# Patient Record
Sex: Male | Born: 1995 | Race: White | Hispanic: No | Marital: Single | Smoking: Light tobacco smoker
Health system: Southern US, Community
[De-identification: ages and names within clinical notes are randomized; demographics above are authoritative.]

## PROBLEM LIST (undated history)

## (undated) DIAGNOSIS — J45909 Unspecified asthma, uncomplicated: Secondary | ICD-10-CM

## (undated) HISTORY — PX: NO PAST SURGERIES: SHX2092

---
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
## 2014-02-25 ENCOUNTER — Encounter: Payer: Self-pay | Admitting: Family Medicine

## 2014-02-25 ENCOUNTER — Ambulatory Visit (INDEPENDENT_AMBULATORY_CARE_PROVIDER_SITE_OTHER): Payer: Medicaid Other | Admitting: Family Medicine

## 2014-02-25 VITALS — Temp 99.0°F | Ht 69.5 in | Wt 269.0 lb

## 2014-02-25 DIAGNOSIS — B349 Viral infection, unspecified: Secondary | ICD-10-CM

## 2014-02-25 MED ORDER — ONDANSETRON HCL 4 MG PO TABS: 4.0000 mg | ORAL_TABLET | Freq: Three times a day (TID) | ORAL | Status: DC | PRN

## 2014-02-25 NOTE — Progress Notes (Signed)
   Subjective:    Patient ID: Luke Barnes, male    DOB: 1995-07-05, 19 y.o.   MRN: [id number]  HPI Patient arrives with complaint of vomiting and diarrhea since yest. Last time vomiting was yest pm but still has some nausea and diarrhea.  Vomited likely five times  Drinking fluids  Diarrhea today also  Felt nauseated today and sg headache  Energy level dimisihed Review of Systems No headache no cough no sore throat no fever    Objective:   Physical Exam Alert mild malaise HEENT normal lungs clear heart regular in rhythm. Abdomen no discrete tenderness good bowel sounds       Assessment & Plan:  Impression viral gastroenteritis plan symptomatic care discussed. Zofran when necessary for nausea. Diet management discussed. WSL

## 2014-02-26 ENCOUNTER — Encounter: Payer: Self-pay | Admitting: Family Medicine

## 2014-03-13 ENCOUNTER — Encounter: Payer: Self-pay | Admitting: Family Medicine

## 2014-03-13 ENCOUNTER — Ambulatory Visit (INDEPENDENT_AMBULATORY_CARE_PROVIDER_SITE_OTHER): Payer: Medicaid Other | Admitting: Family Medicine

## 2014-03-13 VITALS — BP 116/78 | Temp 99.3°F | Ht 66.0 in | Wt 266.0 lb

## 2014-03-13 DIAGNOSIS — J1189 Influenza due to unidentified influenza virus with other manifestations: Secondary | ICD-10-CM | POA: Diagnosis not present

## 2014-03-13 DIAGNOSIS — J111 Influenza due to unidentified influenza virus with other respiratory manifestations: Secondary | ICD-10-CM

## 2014-03-13 MED ORDER — OSELTAMIVIR PHOSPHATE 75 MG PO CAPS: 75.0000 mg | ORAL_CAPSULE | Freq: Two times a day (BID) | ORAL | Status: DC

## 2014-03-13 NOTE — Progress Notes (Signed)
   Subjective:    Patient ID: Luke Barnes, male    DOB: Jan 12, 1995, 19 y.o.   MRN: [id number]  Cough This is a new problem. The current episode started today. Associated symptoms include a fever, headaches, nasal congestion and a sore throat. He has tried nothing for the symptoms.   hfrontal head  A lot of cough  Some aching in the k ees  And elbows  Appetite  tod  Energy level  Low  Wheezing some    Review of Systems  Constitutional: Positive for fever.  HENT: Positive for sore throat.   Respiratory: Positive for cough.   Neurological: Positive for headaches.       Objective:   Physical Exam  Alert moderate malaise. Vitals stable. Low-grade fever. H&[employer] news congestion pharynx normal lungs bronchial cough heart regular rate and rhythm      Assessment & Plan:  Impression influenza discussed plan Tamiflu twice a day 5 days. Symptom care discussed. Warning signs discussed. WSlfL.Influenza-the patient was diagnosed with influenza. Patient/family educated about the flu and warning signs to watch for. If difficulty breathing, severe neck pain and stiffness, cyanosis, disorientation, or progressive worsening then immediately get rechecked at that ER. If progressive symptoms be certain to be rechecked. Supportive measures such as Tylenol/ibuprofen was discussed. No aspirin use in children. And infl

## 2014-03-28 ENCOUNTER — Emergency Department (HOSPITAL_COMMUNITY): Payer: Medicaid Other

## 2014-03-28 ENCOUNTER — Encounter (HOSPITAL_COMMUNITY): Payer: Self-pay | Admitting: Emergency Medicine

## 2014-03-28 ENCOUNTER — Emergency Department (HOSPITAL_COMMUNITY)
Admission: EM | Admit: 2014-03-28 | Discharge: 2014-03-28 | Disposition: A | Discharge status: Home / Self Care | Payer: Medicaid Other | Attending: Emergency Medicine | Admitting: Emergency Medicine

## 2014-03-28 DIAGNOSIS — J45909 Unspecified asthma, uncomplicated: Secondary | ICD-10-CM | POA: Diagnosis present

## 2014-03-28 DIAGNOSIS — Z87891 Personal history of nicotine dependence: Secondary | ICD-10-CM | POA: Diagnosis not present

## 2014-03-28 DIAGNOSIS — J45901 Unspecified asthma with (acute) exacerbation: Secondary | ICD-10-CM | POA: Insufficient documentation

## 2014-03-28 DIAGNOSIS — Z7952 Long term (current) use of systemic steroids: Secondary | ICD-10-CM | POA: Diagnosis not present

## 2014-03-28 DIAGNOSIS — Z79899 Other long term (current) drug therapy: Secondary | ICD-10-CM | POA: Diagnosis not present

## 2014-03-28 HISTORY — DX: Unspecified asthma, uncomplicated: J45.909

## 2014-03-28 LAB — COMPREHENSIVE METABOLIC PANEL
ALT: 66 U/L — AB (ref 0–53)
AST: 41 U/L — ABNORMAL HIGH (ref 0–37)
Albumin: 4.4 g/dL (ref 3.5–5.2)
Alkaline Phosphatase: 117 U/L (ref 39–117)
Anion gap: 10 (ref 5–15)
BUN: 10 mg/dL (ref 6–23)
CALCIUM: 9.2 mg/dL (ref 8.4–10.5)
CO2: 24 mmol/L (ref 19–32)
CREATININE: 0.91 mg/dL (ref 0.50–1.35)
Chloride: 103 mmol/L (ref 96–112)
GFR calc Af Amer: >90 mL/min (ref >90)
GLUCOSE: 107 mg/dL — AB (ref 70–99)
Potassium: 4 mmol/L (ref 3.5–5.1)
SODIUM: 137 mmol/L (ref 135–145)
Total Bilirubin: 0.9 mg/dL (ref 0.3–1.2)
Total Protein: 8.1 g/dL (ref 6.0–8.3)

## 2014-03-28 LAB — CBC WITH DIFFERENTIAL/PLATELET
Basophils Absolute: 0 K/uL (ref 0.0–0.1)
Basophils Relative: 0 % (ref 0–1)
Eosinophils Absolute: 0.2 K/uL (ref 0.0–0.7)
Eosinophils Relative: 1 % (ref 0–5)
HCT: 44.5 % (ref 39.0–52.0)
Hemoglobin: 15.5 g/dL (ref 13.0–17.0)
Lymphocytes Relative: 22 % (ref 12–46)
Lymphs Abs: 3.3 K/uL (ref 0.7–4.0)
MCH: 30 pg (ref 26.0–34.0)
MCHC: 34.8 g/dL (ref 30.0–36.0)
MCV: 86.1 fL (ref 78.0–100.0)
Monocytes Absolute: 0.8 K/uL (ref 0.1–1.0)
Monocytes Relative: 6 % (ref 3–12)
Neutro Abs: 10.6 K/uL — ABNORMAL HIGH (ref 1.7–7.7)
Neutrophils Relative %: 71 % (ref 43–77)
Platelets: 324 K/uL (ref 150–400)
RBC: 5.17 MIL/uL (ref 4.22–5.81)
RDW: 12.9 % (ref 11.5–15.5)
WBC: 14.9 K/uL — ABNORMAL HIGH (ref 4.0–10.5)

## 2014-03-28 MED ORDER — ALBUTEROL SULFATE HFA 108 (90 BASE) MCG/ACT IN AERS: 2.0000 | INHALATION_SPRAY | Freq: Four times a day (QID) | RESPIRATORY_TRACT | Status: DC | PRN

## 2014-03-28 MED ORDER — ALBUTEROL (5 MG/ML) CONTINUOUS INHALATION SOLN
15.0000 mg | INHALATION_SOLUTION | Freq: Once | RESPIRATORY_TRACT | Status: DC
Start: 2014-03-28 — End: 2014-03-28

## 2014-03-28 MED ORDER — IPRATROPIUM-ALBUTEROL 0.5-2.5 (3) MG/3ML IN SOLN: 3.0000 mL | Freq: Once | RESPIRATORY_TRACT | Status: DC

## 2014-03-28 MED ORDER — PREDNISONE 20 MG PO TABS: 20.0000 mg | ORAL_TABLET | Freq: Two times a day (BID) | ORAL | Status: DC

## 2014-03-28 MED ORDER — METHYLPREDNISOLONE SODIUM SUCC 125 MG IJ SOLR
125.0000 mg | Freq: Once | INTRAMUSCULAR | Status: AC
  Administered 2014-03-28: 125 mg via INTRAVENOUS
  Filled 2014-03-28: qty 2

## 2014-03-28 MED ORDER — ALBUTEROL SULFATE (2.5 MG/3ML) 0.083% IN NEBU: 2.5000 mg | INHALATION_SOLUTION | Freq: Once | RESPIRATORY_TRACT | Status: DC

## 2014-03-28 MED ORDER — ALBUTEROL (5 MG/ML) CONTINUOUS INHALATION SOLN
15.0000 mg/h | INHALATION_SOLUTION | Freq: Once | RESPIRATORY_TRACT | Status: AC
  Administered 2014-03-28: 15 mg/h via RESPIRATORY_TRACT
  Filled 2014-03-28: qty 20

## 2014-03-28 MED ORDER — METHYLPREDNISOLONE SODIUM SUCC 125 MG IJ SOLR: 125.0000 mg | Freq: Once | INTRAMUSCULAR | Status: DC

## 2014-03-28 NOTE — ED Notes (Signed)
Pt ambulated in the hall on RA.  Spo2 93-94%.  Pt did not appear in any distress but when asked did report some sob.

## 2014-03-28 NOTE — ED Notes (Signed)
Patient c/o wheezing and shortness of breath that started yesterday afternoon. Patient has hx of asthma and uses inhaler and nebs treatments. Per patient used Nebulizer 2 days ago and has been out of inhaler x3 days. Per patient had flu like syptoms last week but but denies any this week. Denies any fevers.

## 2014-03-28 NOTE — Discharge Instructions (Signed)

## 2014-03-28 NOTE — ED Notes (Signed)
Respiratory paged

## 2014-03-28 NOTE — ED Provider Notes (Signed)
CSN: [id number]     Arrival date & time 03/28/14  0818 History  This chart was scribed for Mancel BaleElliott Luke Azbill, MD by Ronney LionSuzanne Le, ED Scribe. This patient was seen in room APA09/APA09 and the patient's care was started at 8:26 AM.    Chief Complaint  Patient presents with  . Asthma   The history is provided by the patient. No language interpreter was used.    HPI Comments: Luke Barnes is a 19 y.o. male who presents to the Emergency Department complaining of SOB and wheezing that began yesterday afternoon. He endorses associated coughing productive with yellow sputum and feeling "hot and sweaty." Patient states he ran out of his inhaler, which he last used 3 days ago. He still has a nebulizer at home, which he last used 2 days ago, but has been staying at his father's house and hasn't been able to use it. Patient reports dyspnea with walking, as he states that standing up to walk exacerbates his symptoms. Lying supine doesn't affect it. He denies taking any allergy medication. He denies any other medical problems. Patient last had an asthma attack a few years ago. Patient is a former smoker; he quit 3 years ago. He denies fever.   Past Medical History  Diagnosis Date  . Asthma    History reviewed. No pertinent past surgical history. Family History  Problem Relation Age of Onset  . Asthma Father    History  Substance Use Topics  . Smoking status: Former Smoker -- 0.01 packs/day for 1 years    Types: Cigarettes    Quit date: 01/04/2011  . Smokeless tobacco: Never Used  . Alcohol Use: No    Review of Systems  Constitutional: Positive for diaphoresis. Negative for fever.  Respiratory: Positive for shortness of breath and wheezing.   All other systems reviewed and are negative.     Allergies  Review of patient's allergies indicates no known allergies.  Home Medications   Prior to Admission medications   Medication Sig Start Date End Date Taking? Authorizing Provider   beclomethasone (QVAR) 80 MCG/ACT inhaler Inhale 2 puffs into the lungs 2 (two) times daily. 11/08/13  Yes Campbell Richesarolyn C Hoskins, NP  albuterol (PROVENTIL HFA;VENTOLIN HFA) 108 (90 BASE) MCG/ACT inhaler Inhale 2 puffs into the lungs every 6 (six) hours as needed for wheezing or shortness of breath. 03/28/14   Mancel BaleElliott Baylen Dea, MD  predniSONE (DELTASONE) 20 MG tablet Take 1 tablet (20 mg total) by mouth 2 (two) times daily. 03/28/14   Mancel BaleElliott Deamber Buckhalter, MD   BP 107/76 mmHg  Pulse 110  Temp(Src) 98.3 F (36.8 C) (Oral)  Resp 22  Ht 5\' 6"  (1.676 m)  Wt 265 lb (120.203 kg)  BMI 42.79 kg/m2  SpO2 93% Physical Exam  Constitutional: He is oriented to person, place, and time. He appears well-developed and well-nourished.  HENT:  Head: Normocephalic and atraumatic.  Right Ear: External ear normal.  Left Ear: External ear normal.  Eyes: Conjunctivae and EOM are normal. Pupils are equal, round, and reactive to light.  Neck: Normal range of motion and phonation normal. Neck supple.  Cardiovascular: Normal rate, regular rhythm and normal heart sounds.   Pulmonary/Chest: Effort normal. No respiratory distress (No increased work of breathing.). He has wheezes. He exhibits no bony tenderness.  Diffuse expiratory wheezing. No decreased air movement.   Abdominal: Soft. There is no tenderness.  Musculoskeletal: Normal range of motion.  Neurological: He is alert and oriented to person, place, and time. No cranial  nerve deficit or sensory deficit. He exhibits normal muscle tone. Coordination normal.  Skin: Skin is warm, dry and intact.  Psychiatric: He has a normal mood and affect. His behavior is normal. Judgment and thought content normal.  Nursing note and vitals reviewed.   ED Course  Procedures (including critical care time)  Medications  albuterol (PROVENTIL,VENTOLIN) solution continuous neb (15 mg/hr Nebulization Given 03/28/14 0838)  methylPREDNISolone sodium succinate (SOLU-MEDROL) 125 mg/2 mL injection  125 mg (125 mg Intravenous Given 03/28/14 0842)     Patient Vitals for the past 24 hrs:  BP Temp Temp src Pulse Resp SpO2 Height Weight  03/28/14 1224 107/76 mmHg 98.3 F (36.8 C) Oral 110 22 93 % - -  03/28/14 1222 - - - - - 94 % - -  03/28/14 1115 95/60 mmHg 98.1 F (36.7 C) Oral 115 24 95 % - -  03/28/14 1100 95/60 mmHg - - 115 17 92 % - -  03/28/14 1046 101/77 mmHg - - 118 24 92 % - -  03/28/14 1030 - - - (!) 127 23 91 % - -  03/28/14 1000 - - - (!) 122 12 95 % - -  03/28/14 0945 - - - (!) 131 18 97 % - -  03/28/14 0930 113/68 mmHg - - (!) 124 20 96 % - -  03/28/14 0915 - - - (!) 121 16 95 % - -  03/28/14 0900 105/67 mmHg - - 120 15 95 % - -  03/28/14 0845 - - - 119 15 98 % - -  03/28/14 0838 - - - - - 95 % - -  03/28/14 0830 - - - - - 91 % - -  03/28/14 0830 109/60 mmHg - - (!) 121 16 94 % - -  03/28/14 0823 125/89 mmHg 98.2 F (36.8 C) Oral (!) 125 26 91 %  (1.676 m) 265 lb (120.203 kg)    DIAGNOSTIC STUDIES: Oxygen Saturation is 89% on room air, low by my interpretation.    COORDINATION OF CARE: 8:31 AM - Discussed treatment plan with pt at bedside which includes medications and breathing treatment, and pt agreed to plan.   12:01 PM Reevaluation with update and discussion. After initial assessment and treatment, an updated evaluation reveals patient feels better. Oxygen saturation normal on room air. Blood pressure is soft. Lungs have improved air movement, without audible wheezes, Rales or rhonchi. We'll do ambulation trial with oxygen monitoring. [name]   At discharge- Reevaluation with update and discussion. After initial assessment and treatment, an updated evaluation reveals he feels better and is able to ambulate without oxygen desaturation or significant shortness of breath.[name]   Labs Review Labs Reviewed  CBC WITH DIFFERENTIAL/PLATELET - Abnormal; Notable for the following:    WBC 14.9 (*)    Neutro Abs 10.6 (*)    All other components  within normal limits  COMPREHENSIVE METABOLIC PANEL - Abnormal; Notable for the following:    Glucose, Bld 107 (*)    AST 41 (*)    ALT 66 (*)    All other components within normal limits    Imaging Review Dg Chest 2 View  03/28/2014   CLINICAL DATA:  Shortness of breath.  Wheezing.  EXAM: CHEST  2 VIEW  COMPARISON:  None.  FINDINGS: Heart size and vascularity are normal. No infiltrates or effusions. Slight peribronchial thickening. No osseous abnormality.  IMPRESSION: Slight peribronchial thickening consistent with bronchitis.   Electronically Signed   By:  Francene Boyers M.D.   On: 03/28/2014 10:27     EKG Interpretation   Date/Time:  Friday March 28 2014 08:30:06 EDT Ventricular Rate:  120 PR Interval:  134 QRS Duration: 96 QT Interval:  312 QTC Calculation: 441 R Axis:   79 Text Interpretation:  Sinus tachycardia Baseline wander in lead(s) II III  aVR aVL aVF V1 V2 V3 V4 No old tracing to compare Confirmed by [facility]  MD,  Addylynn Balin [phone]) on 03/28/2014 8:37:27 AM      MDM   Final diagnoses:  Asthma exacerbation   Evaluation is consistent with exacerbation of asthma. Doubt ACS, PE or pneumonia.   Nursing Notes Reviewed/ Care Coordinated Applicable Imaging Reviewed Interpretation of Laboratory Data incorporated into ED treatment  The patient appears reasonably screened and/or stabilized for discharge and I doubt any other medical condition or other [facility] requiring further screening, evaluation, or treatment in the ED at this time prior to discharge.  Plan: Home Medications- Albuterol, Prednisone; Home Treatments- rest; return here if the recommended treatment, does not improve the symptoms; Recommended follow up- PCP 1 week   I personally performed the services described in this documentation, which was scribed in my presence. The recorded information has been reviewed and is accurate.       Mancel Bale, MD 03/28/14 [phone]

## 2014-06-29 ENCOUNTER — Emergency Department (HOSPITAL_COMMUNITY): Payer: Medicaid Other

## 2014-06-29 ENCOUNTER — Encounter (HOSPITAL_COMMUNITY): Payer: Self-pay | Admitting: Emergency Medicine

## 2014-06-29 ENCOUNTER — Emergency Department (HOSPITAL_COMMUNITY)
Admission: EM | Admit: 2014-06-29 | Discharge: 2014-06-29 | Disposition: A | Discharge status: Home / Self Care | Payer: Medicaid Other | Attending: Emergency Medicine | Admitting: Emergency Medicine

## 2014-06-29 DIAGNOSIS — Z79899 Other long term (current) drug therapy: Secondary | ICD-10-CM | POA: Diagnosis not present

## 2014-06-29 DIAGNOSIS — Z87891 Personal history of nicotine dependence: Secondary | ICD-10-CM | POA: Diagnosis not present

## 2014-06-29 DIAGNOSIS — Z7951 Long term (current) use of inhaled steroids: Secondary | ICD-10-CM | POA: Insufficient documentation

## 2014-06-29 DIAGNOSIS — W450XXA Nail entering through skin, initial encounter: Secondary | ICD-10-CM | POA: Diagnosis not present

## 2014-06-29 DIAGNOSIS — Y9289 Other specified places as the place of occurrence of the external cause: Secondary | ICD-10-CM | POA: Diagnosis not present

## 2014-06-29 DIAGNOSIS — J45909 Unspecified asthma, uncomplicated: Secondary | ICD-10-CM | POA: Insufficient documentation

## 2014-06-29 DIAGNOSIS — S91332A Puncture wound without foreign body, left foot, initial encounter: Secondary | ICD-10-CM | POA: Diagnosis present

## 2014-06-29 DIAGNOSIS — Z23 Encounter for immunization: Secondary | ICD-10-CM | POA: Diagnosis not present

## 2014-06-29 DIAGNOSIS — Z7952 Long term (current) use of systemic steroids: Secondary | ICD-10-CM | POA: Diagnosis not present

## 2014-06-29 DIAGNOSIS — Y998 Other external cause status: Secondary | ICD-10-CM | POA: Diagnosis not present

## 2014-06-29 DIAGNOSIS — Y9389 Activity, other specified: Secondary | ICD-10-CM | POA: Insufficient documentation

## 2014-06-29 MED ORDER — AMOXICILLIN-POT CLAVULANATE 875-125 MG PO TABS
1.0000 | ORAL_TABLET | Freq: Once | ORAL | Status: AC
  Administered 2014-06-29: 1 via ORAL
  Filled 2014-06-29: qty 1

## 2014-06-29 MED ORDER — NAPROXEN 500 MG PO TABS: 500.0000 mg | ORAL_TABLET | Freq: Two times a day (BID) | ORAL | Status: DC

## 2014-06-29 MED ORDER — AMOXICILLIN-POT CLAVULANATE 875-125 MG PO TABS: 1.0000 | ORAL_TABLET | Freq: Two times a day (BID) | ORAL | Status: DC

## 2014-06-29 MED ORDER — TETANUS-DIPHTH-ACELL PERTUSSIS 5-2.5-18.5 LF-MCG/0.5 IM SUSP
0.5000 mL | Freq: Once | INTRAMUSCULAR | Status: AC
  Administered 2014-06-29: 0.5 mL via INTRAMUSCULAR
  Filled 2014-06-29: qty 0.5

## 2014-06-29 NOTE — ED Provider Notes (Signed)
CSN: [id number]     Arrival date & time 06/29/14  1717 History  This chart was scribed for non-physician practitioner, Pauline Aus, PA-C, working with Rolland Porter, MD, by Roxy Cedar ED Scribe. This patient was seen in room APFT21/APFT21 and the patient's care was started at 5:32 PM    Chief Complaint  Patient presents with  . Puncture Wound   Patient is a 19 y.o. male presenting with foot injury. The history is provided by the patient. No language interpreter was used.  Foot Injury Location:  Foot Foot location:  L foot and sole of L foot Pain details:    Radiates to:  Does not radiate   Severity:  Moderate   Onset quality:  Sudden   Duration:  2 hours   Timing:  Constant   Progression:  Unchanged Chronicity:  New Associated symptoms: no back pain and no fever     HPI Comments: Luke Barnes is a 19 y.o. male who presents to the Emergency Department complaining of moderate pain to left foot due to puncture wound that occurred 1.5 hours ago. Patient states he was cleaning out an old trailer and throwing trash away when he accidentally stepped on an old, rusted nail. He states that nail went through his shoe and into his foot. Pt denies excessive bleeding, swelling, numbness or any other associated injury. He states he pull the nail back out with pliers and applied neosporin and a bandaid to wound. He states that the wound initially bled but is currently controlled. He reports associated pain with movement of toes. He denies pain to dorsal aspect of foot. His last tetanus shot was given 7 years ago. NKDA.  Past Medical History  Diagnosis Date  . Asthma    History reviewed. No pertinent past surgical history. Family History  Problem Relation Age of Onset  . Asthma Father    History  Substance Use Topics  . Smoking status: Former Smoker -- 0.01 packs/day for 1 years    Types: Cigarettes    Quit date: 01/04/2011  . Smokeless tobacco: Never Used  . Alcohol Use: No   Review  of Systems  Constitutional: Negative for fever and chills.  Musculoskeletal: Negative for back pain, joint swelling and arthralgias.  Skin: Positive for wound (puncture wound to left foot).       Laceration   Neurological: Negative for dizziness, weakness and numbness.  Hematological: Does not bruise/bleed easily.  All other systems reviewed and are negative.  Allergies  Review of patient's allergies indicates no known allergies.  Home Medications   Prior to Admission medications   Medication Sig Start Date End Date Taking? Authorizing Provider  albuterol (PROVENTIL HFA;VENTOLIN HFA) 108 (90 BASE) MCG/ACT inhaler Inhale 2 puffs into the lungs every 6 (six) hours as needed for wheezing or shortness of breath. 03/28/14   Mancel Bale, MD  beclomethasone (QVAR) 80 MCG/ACT inhaler Inhale 2 puffs into the lungs 2 (two) times daily. 11/08/13   Campbell Riches, NP  predniSONE (DELTASONE) 20 MG tablet Take 1 tablet (20 mg total) by mouth 2 (two) times daily. 03/28/14   Mancel Bale, MD   Triage Vitals: BP 131/67 mmHg  Pulse 100  Temp(Src) 99.4 F (37.4 C) (Oral)  Resp 18  Ht 5\' 9"  (1.753 m)  Wt 245 lb (111.131 kg)  BMI 36.16 kg/m2  SpO2 99%  Physical Exam  Constitutional: He appears well-developed and well-nourished.  HENT:  Head: Normocephalic and atraumatic.  Cardiovascular: Normal rate and normal  heart sounds.   Pulmonary/Chest: Effort normal. No respiratory distress.  Musculoskeletal: Normal range of motion. He exhibits tenderness. He exhibits no edema.  Neurological: He is alert. Coordination normal.  Distal sensation in tact.   Skin: Skin is warm and dry. No rash noted. He is not diaphoretic. No erythema.  Puncture wound to the plantar surface of the left foot between the first and second toes. Bleeding controlled.   Psychiatric: He has a normal mood and affect.  Nursing note and vitals reviewed.  ED Course  Procedures (including critical care time)  DIAGNOSTIC  STUDIES: Oxygen Saturation is 99% on RA, normal by my interpretation.    COORDINATION OF CARE: 5:39 PM- Discussed plans to order diagnostic imaging of left foot. Will give pt Tdap booster shot. Advised patient to cleanse foot, apply band aid and observe for sings of infection. Advised patient to return if he has onset of increased pain, redness or streaking.  Pt advised of plan for treatment and pt agrees.  Labs Review Labs Reviewed - No data to display  Imaging Review Dg Foot Complete Left  06/29/2014   CLINICAL DATA:  Stepped on a rusty nail today. In true wound between the left first and second toes.  EXAM: LEFT FOOT - COMPLETE 3+ VIEW  COMPARISON:  None.  FINDINGS: No fracture, foreign body, malalignment, abnormal gas in soft tissues, or other significant abnormality.  IMPRESSION: Negative.   Electronically Signed   By: Gaylyn Rong M.D.   On: 06/29/2014 18:01     EKG Interpretation None     MDM   Final diagnoses:  Puncture wound of foot, left, initial encounter   NV intact. XR neg for FB.  Pt advised of risk of infection and agrees to close f/u and to return here for worsening sx's   I personally performed the services described in this documentation, which was scribed in my presence. The recorded information has been reviewed and is accurate.    Pauline Aus, PA-C 07/01/14 1841  Rolland Porter, MD 07/07/14 2037

## 2014-06-29 NOTE — ED Notes (Signed)
Per patient stepped on dirty nail, which punctured left foot. Patient reports nail penetrating foot approx 1.5 inches in which he removed. Denies any bleeding at this time. Per patient last tdap at 19 years old (7 years ago).

## 2014-06-29 NOTE — Discharge Instructions (Signed)
Puncture Wound °A puncture wound happens when a sharp object pokes a hole in the skin. A puncture wound can cause an infection because germs can go under the skin during the injury. °HOME CARE  °· Change your bandage (dressing) once a day, or as told by your doctor. If the bandage sticks, soak it in water. °· Keep all doctor visits as told. °· Only take medicine as told by your doctor. °· Take your medicine (antibiotics) as told. Finish them even if you start to feel better. °You may need a tetanus shot if: °· You cannot remember when you had your last tetanus shot. °· You have never had a tetanus shot. °If you need a tetanus shot and you choose not to have one, you may get tetanus. Sickness from tetanus can be serious. °You may need a rabies shot if an animal bite caused your wound. °GET HELP RIGHT AWAY IF:  °· Your wound is red, puffy (swollen), or painful. °· You see red lines on the skin near the wound. °· You have a bad smell coming from the wound or bandage. °· You have yellowish-white fluid (pus) coming from the wound. °· Your medicine is not working. °· You notice an object in the wound. °· You have a fever. °· You have severe pain. °· You have trouble breathing. °· You feel dizzy or pass out (faint). °· You keep throwing up (vomiting). °· You lose feeling (numbness) in your arm or leg, or you cannot move your arm or leg. °· Your problems get worse. °MAKE SURE YOU:  °· Understand these instructions. °· Will watch your condition. °· Will get help right away if you are not doing well or get worse. °Document Released: 09/29/2007 Document Revised: 03/14/2011 Document Reviewed: 06/08/2010 °[employer]® Patient Information ©2015 [employer]. This information is not intended to replace advice given to you by your health care provider. Make sure you discuss any questions you have with your health care provider. ° °

## 2014-08-25 ENCOUNTER — Emergency Department (HOSPITAL_COMMUNITY): Payer: Medicaid Other

## 2014-08-25 ENCOUNTER — Emergency Department (HOSPITAL_COMMUNITY)
Admission: EM | Admit: 2014-08-25 | Discharge: 2014-08-25 | Disposition: A | Discharge status: Home / Self Care | Payer: Medicaid Other | Attending: Emergency Medicine | Admitting: Emergency Medicine

## 2014-08-25 ENCOUNTER — Encounter (HOSPITAL_COMMUNITY): Payer: Self-pay | Admitting: *Deleted

## 2014-08-25 DIAGNOSIS — J45909 Unspecified asthma, uncomplicated: Secondary | ICD-10-CM | POA: Diagnosis not present

## 2014-08-25 DIAGNOSIS — S4991XA Unspecified injury of right shoulder and upper arm, initial encounter: Secondary | ICD-10-CM | POA: Diagnosis not present

## 2014-08-25 DIAGNOSIS — Z87891 Personal history of nicotine dependence: Secondary | ICD-10-CM | POA: Insufficient documentation

## 2014-08-25 DIAGNOSIS — Y9389 Activity, other specified: Secondary | ICD-10-CM | POA: Diagnosis not present

## 2014-08-25 DIAGNOSIS — Y92410 Unspecified street and highway as the place of occurrence of the external cause: Secondary | ICD-10-CM | POA: Diagnosis not present

## 2014-08-25 DIAGNOSIS — M545 Low back pain, unspecified: Secondary | ICD-10-CM

## 2014-08-25 DIAGNOSIS — Z79899 Other long term (current) drug therapy: Secondary | ICD-10-CM | POA: Diagnosis not present

## 2014-08-25 DIAGNOSIS — Y998 Other external cause status: Secondary | ICD-10-CM | POA: Insufficient documentation

## 2014-08-25 DIAGNOSIS — S3992XA Unspecified injury of lower back, initial encounter: Secondary | ICD-10-CM | POA: Diagnosis not present

## 2014-08-25 DIAGNOSIS — S199XXA Unspecified injury of neck, initial encounter: Secondary | ICD-10-CM | POA: Diagnosis present

## 2014-08-25 DIAGNOSIS — S161XXA Strain of muscle, fascia and tendon at neck level, initial encounter: Secondary | ICD-10-CM | POA: Diagnosis not present

## 2014-08-25 DIAGNOSIS — M25511 Pain in right shoulder: Secondary | ICD-10-CM

## 2014-08-25 MED ORDER — IBUPROFEN 600 MG PO TABS: 600.0000 mg | ORAL_TABLET | Freq: Four times a day (QID) | ORAL | Status: DC | PRN

## 2014-08-25 MED ORDER — CYCLOBENZAPRINE HCL 5 MG PO TABS: 5.0000 mg | ORAL_TABLET | Freq: Three times a day (TID) | ORAL | Status: DC | PRN
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
## 2014-08-27 ENCOUNTER — Ambulatory Visit (INDEPENDENT_AMBULATORY_CARE_PROVIDER_SITE_OTHER): Payer: Medicaid Other | Admitting: Family Medicine

## 2014-08-27 ENCOUNTER — Encounter: Payer: Self-pay | Admitting: Family Medicine

## 2014-08-27 VITALS — Ht 66.0 in | Wt 256.2 lb

## 2014-08-27 DIAGNOSIS — M25511 Pain in right shoulder: Secondary | ICD-10-CM
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
## 2014-12-09 ENCOUNTER — Ambulatory Visit: Payer: Self-pay | Admitting: Nurse Practitioner

## 2014-12-10 ENCOUNTER — Other Ambulatory Visit: Payer: Self-pay | Admitting: Family Medicine

## 2015-01-11 ENCOUNTER — Emergency Department
Admission: EM | Admit: 2015-01-11 | Discharge: 2015-01-11 | Disposition: A | Discharge status: Home / Self Care | Payer: Medicaid Other | Attending: Emergency Medicine | Admitting: Emergency Medicine

## 2015-01-11 ENCOUNTER — Emergency Department: Payer: Medicaid Other

## 2015-01-11 ENCOUNTER — Encounter: Payer: Self-pay | Admitting: Emergency Medicine

## 2015-01-11 DIAGNOSIS — J45901 Unspecified asthma with (acute) exacerbation: Secondary | ICD-10-CM | POA: Insufficient documentation

## 2015-01-11 DIAGNOSIS — Z87891 Personal history of nicotine dependence: Secondary | ICD-10-CM | POA: Insufficient documentation

## 2015-01-11 DIAGNOSIS — R000 Tachycardia, unspecified: Secondary | ICD-10-CM | POA: Insufficient documentation

## 2015-01-11 DIAGNOSIS — R0602 Shortness of breath: Secondary | ICD-10-CM | POA: Diagnosis present

## 2015-01-11 MED ORDER — ALBUTEROL SULFATE (2.5 MG/3ML) 0.083% IN NEBU
2.5000 mg | INHALATION_SOLUTION | Freq: Once | RESPIRATORY_TRACT | Status: DC
Start: 2015-01-11 — End: 2015-01-11

## 2015-01-11 MED ORDER — PREDNISONE 50 MG PO TABS: 50.0000 mg | ORAL_TABLET | Freq: Every day | ORAL | Status: DC

## 2015-01-11 MED ORDER — IPRATROPIUM-ALBUTEROL 0.5-2.5 (3) MG/3ML IN SOLN
6.0000 mL | Freq: Once | RESPIRATORY_TRACT | Status: AC
  Administered 2015-01-11: 6 mL via RESPIRATORY_TRACT

## 2015-01-11 MED ORDER — ALBUTEROL SULFATE (2.5 MG/3ML) 0.083% IN NEBU
2.5000 mg | INHALATION_SOLUTION | Freq: Once | RESPIRATORY_TRACT | Status: AC
  Administered 2015-01-11: 2.5 mg via RESPIRATORY_TRACT

## 2015-01-11 MED ORDER — PREDNISONE 20 MG PO TABS
100.0000 mg | ORAL_TABLET | Freq: Once | ORAL | Status: AC
  Administered 2015-01-11: 100 mg via ORAL
  Filled 2015-01-11: qty 5

## 2015-01-11 MED ORDER — METHYLPREDNISOLONE SODIUM SUCC 125 MG IJ SOLR: 125.0000 mg | Freq: Once | INTRAMUSCULAR | Status: DC

## 2015-01-11 MED ORDER — ALBUTEROL SULFATE (2.5 MG/3ML) 0.083% IN NEBU
INHALATION_SOLUTION | RESPIRATORY_TRACT | Status: AC
  Administered 2015-01-11: 2.5 mg via RESPIRATORY_TRACT
  Filled 2015-01-11: qty 3

## 2015-01-11 MED ORDER — IPRATROPIUM-ALBUTEROL 0.5-2.5 (3) MG/3ML IN SOLN
RESPIRATORY_TRACT | Status: AC
  Administered 2015-01-11: 6 mL via RESPIRATORY_TRACT
  Filled 2015-01-11: qty 9

## 2015-01-11 MED ORDER — ALBUTEROL SULFATE HFA 108 (90 BASE) MCG/ACT IN AERS
2.0000 | INHALATION_SPRAY | Freq: Once | RESPIRATORY_TRACT | Status: AC
  Administered 2015-01-11: 2 via RESPIRATORY_TRACT
  Filled 2015-01-11: qty 6.7

## 2015-01-11 MED ORDER — ALBUTEROL SULFATE HFA 108 (90 BASE) MCG/ACT IN AERS: 2.0000 | INHALATION_SPRAY | Freq: Once | RESPIRATORY_TRACT | Status: DC

## 2015-01-11 NOTE — ED Notes (Signed)
Pt state he has been coughing this morning and is unable to obtain a refill of his inhaler until next month.

## 2015-01-11 NOTE — ED Notes (Signed)
C/o sob.  Inspiratory wheezing noted at triage.  Skin w/d.  Pt reports sold sx and hxof asthma, out of inhalers.

## 2015-01-11 NOTE — ED Provider Notes (Signed)
[facility] Emergency Department Provider Note  ____________________________________________  Time seen: On arrival  I have reviewed the triage vital signs and the nursing notes.   HISTORY  Chief Complaint Respiratory Distress    HPI Luke Barnes is a 20 y.o. male who presents with shortness of breath. He reports his asthma has been acting up over the past couple days but became much worse this morning. He is out of his rescue inhaler. He does not smoke but his mother does. No fevers or chills. Nonproductive cough. No recent travel, leg pain or swelling.     Past Medical History  Diagnosis Date  . Asthma     Patient Active Problem List   Diagnosis Date Noted  . Asthma, chronic 04/01/2012  . ADHD (attention deficit hyperactivity disorder) 04/01/2012    History reviewed. No pertinent past surgical history.  Current Outpatient Rx  Name  Route  Sig  Dispense  Refill  . cyclobenzaprine (FLEXERIL) 5 MG tablet   Oral   Take 1 tablet (5 mg total) by mouth 3 (three) times daily as needed for muscle spasms.   15 tablet   0   . ibuprofen (ADVIL,MOTRIN) 600 MG tablet   Oral   Take 1 tablet (600 mg total) by mouth every 6 (six) hours as needed.   15 tablet   0   . PROVENTIL HFA 108 (90 BASE) MCG/ACT inhaler      INHALE 2 PUFFS EVERY 4 HOURS AS NEEDED FOR WHEEZING OR SHORTNESS OF BREATH.   6.7 g   0     Allergies Review of patient's allergies indicates no known allergies.  Family History  Problem Relation Age of Onset  . Asthma Father     Social History Social History  Substance Use Topics  . Smoking status: Former Smoker -- 0.01 packs/day for 1 years    Types: Cigarettes    Quit date: 01/04/2011  . Smokeless tobacco: Never Used  . Alcohol Use: No    Review of Systems  Constitutional: Negative for fever or chills Eyes: Negative for discharge. ENT: Negative for sore throat Cardiovascular: Negative for chest pain. Respiratory:  As above Gastrointestinal: Negative for abdominal pain  Musculoskeletal: Negative for back pain. Skin: Negative for rash. Neurological: Negative for headaches Psychiatric: No anxiety or depression    ____________________________________________   PHYSICAL EXAM:  VITAL SIGNS: ED Triage Vitals  Enc Vitals Group     BP 01/11/15 1024 135/79 mmHg     Pulse Rate 01/11/15 1024 101     Resp 01/11/15 1024 20     Temp --      Temp src --      SpO2 01/11/15 1024 93 %     Weight 01/11/15 1024 250 lb (113.399 kg)     Height 01/11/15 1024 6\' 2"  (1.88 m)     Head Cir --      Peak Flow --      Pain Score 01/11/15 1025 0     Pain Loc --      Pain Edu? --      Excl. in GC? --      Constitutional: Alert and oriented. Well appearing and in no distress. Eyes: Conjunctivae are normal.  ENT   Head: Normocephalic and atraumatic.   Mouth/Throat: Mucous membranes are moist. Cardiovascular: Tachycardic,. normal and symmetric distal pulses are present in all extremities. No murmurs, rubs, or gallops. Respiratory: Mild tachypnea, wheezing bilateral Gastrointestinal: Soft and non-tender in all quadrants. No distention.  There is no CVA tenderness. Genitourinary: deferred Musculoskeletal: Nontender with normal range of motion in all extremities. No lower extremity tenderness nor edema. Neurologic:  Normal speech and language. No gross focal neurologic deficits are appreciated. Skin:  Skin is warm, dry and intact. No rash noted. Psychiatric: Mood and affect are normal. Patient exhibits appropriate insight and judgment.  ____________________________________________    LABS (pertinent positives/negatives)  Labs Reviewed - No data to display  ____________________________________________   EKG  None  ____________________________________________    RADIOLOGY I have personally reviewed any xrays that were ordered on this patient: Chest x-ray  unremarkable  ____________________________________________   PROCEDURES  Procedure(s) performed: none  Critical Care performed: none  ____________________________________________   INITIAL IMPRESSION / ASSESSMENT AND PLAN / ED COURSE  Pertinent labs & imaging results that were available during my care of the patient were reviewed by me and considered in my medical decision making (see chart for details).  Patient presents with asthma exacerbation with moderate intensity. Prednisone 100 mg by mouth ordered as well as DuoNeb x3. We will reassess after treatment  Patient with significant improvement after treatment. No further wheezing on exam. Heart rate has normalized and he feels significant better. Offered admission but patient would like to go home. We will provide him with an inhaler. Return precautions discussed at length. ____________________________________________   FINAL CLINICAL IMPRESSION(S) / ED DIAGNOSES  Final diagnoses:  Asthma exacerbation     Jene Every, MD 01/11/15 [phone]

## 2015-01-11 NOTE — Discharge Instructions (Signed)
Asthma Attack Prevention While you may not be able to control the fact that you have asthma, you can take actions to prevent asthma attacks. The best way to prevent asthma attacks is to maintain good control of your asthma. You can achieve this by:  Taking your medicines as directed.  Avoiding things that can irritate your airways or make your asthma symptoms worse (asthma triggers).  Keeping track of how well your asthma is controlled and of any changes in your symptoms.  Responding quickly to worsening asthma symptoms (asthma attack).  Seeking emergency care when it is needed. WHAT ARE SOME WAYS TO PREVENT AN ASTHMA ATTACK? Have a Plan Work with your health care provider to create a written plan for managing and treating your asthma attacks (asthma action plan). This plan includes:  A list of your asthma triggers and how you can avoid them.  Information on when medicines should be taken and when their dosages should be changed.  The use of a device that measures how well your lungs are working (peak flow meter). Monitor Your Asthma Use your peak flow meter and record your results in a journal every day. A drop in your peak flow numbers on one or more days may indicate the start of an asthma attack. This can happen even before you start to feel symptoms. You can prevent an asthma attack from getting worse by following the steps in your asthma action plan. Avoid Asthma Triggers Work with your asthma health care provider to find out what your asthma triggers are. This can be done by:  Allergy testing.  Keeping a journal that notes when asthma attacks occur and the factors that may have contributed to them.  Determining if there are other medical conditions that are making your asthma worse. Once you have determined your asthma triggers, take steps to avoid them. This may include avoiding excessive or prolonged exposure to:  Dust. Have someone dust and vacuum your home for you once or  twice a week. Using a high-efficiency particulate arrestance (HEPA) vacuum is best.  Smoke. This includes campfire smoke, forest fire smoke, and secondhand smoke from tobacco products.  Pet dander. Avoid contact with animals that you know you are allergic to.  Allergens from trees, grasses or pollens. Avoid spending a lot of time outdoors when pollen counts are high, and on very windy days.  Very cold, dry, or humid air.  Mold.  Foods that contain high amounts of sulfites.  Strong odors.  Outdoor air pollutants, such as engine exhaust.  Indoor air pollutants, such as aerosol sprays and fumes from household cleaners.  Household pests, including dust mites and cockroaches, and pest droppings.  Certain medicines, including NSAIDs. Always talk to your health care provider before stopping or starting any new medicines. Medicines Take over-the-counter and prescription medicines only as told by your health care provider. Many asthma attacks can be prevented by carefully following your medicine schedule. Taking your medicines correctly is especially important when you cannot avoid certain asthma triggers. Act Quickly If an asthma attack does happen, acting quickly can decrease how severe it is and how long it lasts. Take these steps:   Pay attention to your symptoms. If you are coughing, wheezing, or having difficulty breathing, do not wait to see if your symptoms go away on their own. Follow your asthma action plan.  If you have followed your asthma action plan and your symptoms are not improving, call your health care provider or seek immediate medical care   at the nearest hospital. It is important to note how often you need to use your fast-acting rescue inhaler. If you are using your rescue inhaler more often, it may mean that your asthma is not under control. Adjusting your asthma treatment plan may help you to prevent future asthma attacks and help you to gain better control of your  condition. HOW CAN I PREVENT AN ASTHMA ATTACK WHEN I EXERCISE? Follow advice from your health care provider about whether you should use your fast-acting inhaler before exercising. Many people with asthma experience exercise-induced bronchoconstriction (EIB). This condition often worsens during vigorous exercise in cold, humid, or dry environments. Usually, people with EIB can stay very active by pre-treating with a fast-acting inhaler before exercising.   This information is not intended to replace advice given to you by your health care provider. Make sure you discuss any questions you have with your health care provider.   Document Released: 12/08/2008 Document Revised: 09/10/2014 Document Reviewed: 05/22/2014 Elsevier Interactive Patient Education 2016 [employer]  

## 2015-01-19 ENCOUNTER — Encounter: Payer: Self-pay | Admitting: Family Medicine

## 2015-01-19 ENCOUNTER — Ambulatory Visit (INDEPENDENT_AMBULATORY_CARE_PROVIDER_SITE_OTHER): Payer: Medicaid Other | Admitting: Family Medicine

## 2015-01-19 VITALS — BP 118/70 | Temp 99.2°F | Ht 72.0 in | Wt 257.0 lb

## 2015-01-19 DIAGNOSIS — J4520 Mild intermittent asthma, uncomplicated: Secondary | ICD-10-CM

## 2015-01-19 MED ORDER — PREDNISONE 20 MG PO TABS: ORAL_TABLET | ORAL | Status: DC

## 2015-01-19 MED ORDER — ALBUTEROL SULFATE HFA 108 (90 BASE) MCG/ACT IN AERS: INHALATION_SPRAY | RESPIRATORY_TRACT | Status: DC

## 2015-01-19 MED ORDER — BECLOMETHASONE DIPROPIONATE 40 MCG/ACT IN AERS: 2.0000 | INHALATION_SPRAY | Freq: Two times a day (BID) | RESPIRATORY_TRACT | Status: DC

## 2015-01-19 NOTE — Progress Notes (Signed)
   Subjective:    Patient ID: Luke Barnes, male    DOB: 1995-09-29, 20 y.o.   MRN: [id number]  HPIFollow up on asthma. Pt states he is having to use his inhaler 5 -6 6 times a day. Normally he uses twice daily.      No longer smoking next  Is been using inhaler fairly frequently in recent weeks and excessively for months.   Review of Systems  no headache no chest pain and back pain no fever nonproductive cough    Objective:   Physical Exam    alert vital stable HEENT normal lungs faint wheeze no tachypnea heart rare rhythm     Assessment & Plan:   asthma suboptimum control plan add Qvar 2 sprays twice a day prednisone taper symptom care discussed WSL

## 2015-11-17 ENCOUNTER — Telehealth: Payer: Self-pay | Admitting: *Deleted

## 2015-11-17 ENCOUNTER — Other Ambulatory Visit: Payer: Self-pay | Admitting: *Deleted

## 2015-11-17 MED ORDER — FLUTICASONE-SALMETEROL 100-50 MCG/DOSE IN AEPB: 1.0000 | INHALATION_SPRAY | Freq: Two times a day (BID) | RESPIRATORY_TRACT | 2 refills | Status: DC

## 2015-11-17 NOTE — Telephone Encounter (Signed)
Refills sent to pharm

## 2015-11-17 NOTE — Telephone Encounter (Signed)
Ok thre e mo worth 

## 2015-11-17 NOTE — Telephone Encounter (Signed)
Refill request for advair 100/5 #60 inhale one dose twice daily. Originally prescribed by [facility] not on med list. Pt last [facility] 01/19/15 for asthma[place] [facility]  [place]

## 2015-12-06 ENCOUNTER — Other Ambulatory Visit: Payer: Self-pay | Admitting: Family Medicine

## 2016-04-15 ENCOUNTER — Other Ambulatory Visit: Payer: Self-pay | Admitting: Family Medicine

## 2016-05-09 ENCOUNTER — Ambulatory Visit (INDEPENDENT_AMBULATORY_CARE_PROVIDER_SITE_OTHER): Payer: 59 | Admitting: Family Medicine

## 2016-05-09 ENCOUNTER — Encounter: Payer: Self-pay | Admitting: Family Medicine

## 2016-05-09 VITALS — BP 118/84 | Temp 98.4°F | Ht 71.0 in | Wt 216.0 lb

## 2016-05-09 DIAGNOSIS — J4520 Mild intermittent asthma, uncomplicated: Secondary | ICD-10-CM

## 2016-05-09 MED ORDER — ALBUTEROL SULFATE HFA 108 (90 BASE) MCG/ACT IN AERS: 2.0000 | INHALATION_SPRAY | Freq: Four times a day (QID) | RESPIRATORY_TRACT | 5 refills | Status: DC | PRN

## 2016-05-09 MED ORDER — FLUTICASONE-SALMETEROL 100-50 MCG/DOSE IN AEPB: 1.0000 | INHALATION_SPRAY | Freq: Two times a day (BID) | RESPIRATORY_TRACT | 11 refills | Status: DC

## 2016-05-09 NOTE — Progress Notes (Signed)
   Subjective:    Patient ID: Luke Barnes, male    DOB: 1995/05/18, 21 y.o.   MRN: [id number]  Asthma  This is a chronic problem. His past medical history is significant for asthma.  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 Blood pressure good on repeat. HEENT normal. Lungs clear. Heart regular rate and rhythm.      Assessment & Plan:  Impression chronic persistent asthma stable when on Advair not stable and off discussed importance of compliance discussed medications refilled

## 2016-05-17 ENCOUNTER — Telehealth: Payer: Self-pay | Admitting: Family Medicine

## 2016-05-17 MED ORDER — ALBUTEROL SULFATE HFA 108 (90 BASE) MCG/ACT IN AERS: 2.0000 | INHALATION_SPRAY | Freq: Four times a day (QID) | RESPIRATORY_TRACT | 5 refills | Status: DC | PRN

## 2016-05-17 NOTE — Telephone Encounter (Signed)
Patient was seen 5/7 and prescribe ventolin inhaler and his insurance wouldn't cover it,he states cant afford it can you change it to a different inhaler. [facility]

## 2016-05-17 NOTE — Telephone Encounter (Signed)
Albuterol generic two sprays qid prn

## 2016-05-17 NOTE — Telephone Encounter (Signed)
Spoke with patient and informed him per Dr.Steve Luking-we are sending in generic albuterol inhaler. Patient verbalized understanding.

## 2016-07-26 ENCOUNTER — Ambulatory Visit (INDEPENDENT_AMBULATORY_CARE_PROVIDER_SITE_OTHER): Payer: 59 | Admitting: Family Medicine

## 2016-07-26 ENCOUNTER — Encounter: Payer: Self-pay | Admitting: Family Medicine

## 2016-07-26 VITALS — BP 122/76 | Ht 71.0 in | Wt 217.8 lb

## 2016-07-26 DIAGNOSIS — J4520 Mild intermittent asthma, uncomplicated: Secondary | ICD-10-CM

## 2016-07-26 NOTE — Progress Notes (Signed)
   Subjective:    Patient ID: Luke Barnes, male    DOB: [birth year], 21 y.o.   MRN: [id number]  HPI Patient arrives and states his insurance will no longer pay for Proventil and he needs his inhaler changed to ventolin.   Compliant with Advair. States it is definitely helps him. Having difficulty getting albuterol covered by his insurance.  No chest pain no headache  Fortunately continues to exercise  Using advair bid  Ran out of albuterol, using aunt's albuterol   Review of Systems No headache, no major weight loss or weight gain, no chest pain no back pain abdominal pain no change in bowel habits complete ROS otherwise negative     Objective:   Physical Exam Alert and oriented, vitals reviewed and stable, NAD ENT-TM's and ext canals WNL bilat via otoscopic exam Soft palate, tonsils and post pharynx WNL via oropharyngeal exam Neck-symmetric, no masses; thyroid nonpalpable and nontender Pulmonary-no tachypnea or accessory muscle use; Clear without wheezes via auscultation Card--no abnrml murmurs, rhythm reg and rate WNL Carotid pulses symmetric, without bruits Of this on repeat.       Assessment & Plan:  Impression asthma clinically stable. With need for rescue inhaler. We'll work with the pharmacy to try to obtain

## 2016-08-31 ENCOUNTER — Encounter: Payer: Self-pay | Admitting: Emergency Medicine

## 2016-08-31 ENCOUNTER — Ambulatory Visit: Payer: Self-pay

## 2016-08-31 ENCOUNTER — Ambulatory Visit
Admission: EM | Admit: 2016-08-31 | Discharge: 2016-08-31 | Disposition: A | Discharge status: Home / Self Care | Payer: Medicaid Other | Attending: Family Medicine | Admitting: Family Medicine

## 2016-08-31 DIAGNOSIS — R748 Abnormal levels of other serum enzymes: Secondary | ICD-10-CM | POA: Diagnosis not present

## 2016-08-31 DIAGNOSIS — F17210 Nicotine dependence, cigarettes, uncomplicated: Secondary | ICD-10-CM | POA: Insufficient documentation

## 2016-08-31 DIAGNOSIS — R0602 Shortness of breath: Secondary | ICD-10-CM | POA: Insufficient documentation

## 2016-08-31 DIAGNOSIS — E860 Dehydration: Secondary | ICD-10-CM | POA: Diagnosis not present

## 2016-08-31 DIAGNOSIS — R0600 Dyspnea, unspecified: Secondary | ICD-10-CM

## 2016-08-31 DIAGNOSIS — J45901 Unspecified asthma with (acute) exacerbation: Secondary | ICD-10-CM | POA: Insufficient documentation

## 2016-08-31 DIAGNOSIS — R0789 Other chest pain: Secondary | ICD-10-CM | POA: Diagnosis not present

## 2016-08-31 LAB — COMPREHENSIVE METABOLIC PANEL
ALK PHOS: 101 U/L (ref 38–126)
ALT: 39 U/L (ref 17–63)
ANION GAP: 15 (ref 5–15)
AST: 44 U/L — ABNORMAL HIGH (ref 15–41)
Albumin: 5.1 g/dL — ABNORMAL HIGH (ref 3.5–5.0)
BILIRUBIN TOTAL: 0.9 mg/dL (ref 0.3–1.2)
BUN: 24 mg/dL — ABNORMAL HIGH (ref 6–20)
CALCIUM: 9.9 mg/dL (ref 8.9–10.3)
CO2: 20 mmol/L — ABNORMAL LOW (ref 22–32)
Chloride: 100 mmol/L — ABNORMAL LOW (ref 101–111)
Creatinine, Ser: 1.25 mg/dL — ABNORMAL HIGH (ref 0.61–1.24)
GFR calc non Af Amer: >60 mL/min (ref >60)
Glucose, Bld: 111 mg/dL — ABNORMAL HIGH (ref 65–99)
Potassium: 3.9 mmol/L (ref 3.5–5.1)
Sodium: 135 mmol/L (ref 135–145)
Total Protein: 8.6 g/dL — ABNORMAL HIGH (ref 6.5–8.1)

## 2016-08-31 LAB — URINALYSIS, COMPLETE (UACMP) WITH MICROSCOPIC
GLUCOSE, UA: NEGATIVE mg/dL
Hgb urine dipstick: NEGATIVE
LEUKOCYTES UA: NEGATIVE
NITRITE: NEGATIVE
PH: 5.5 (ref 5.0–8.0)
Protein, ur: 100 mg/dL — AB
Specific Gravity, Urine: >1.03 — ABNORMAL HIGH (ref 1.005–1.030)
Squamous Epithelial / LPF: NONE SEEN

## 2016-08-31 LAB — CK: Total CK: 790 U/L — ABNORMAL HIGH (ref 49–397)

## 2016-08-31 LAB — FIBRIN DERIVATIVES D-DIMER (ARMC ONLY): Fibrin derivatives D-dimer (ARMC): 146.58 (ref 0.00–499.00)

## 2016-08-31 LAB — CBC WITH DIFFERENTIAL/PLATELET
BASOS PCT: 1 %
Basophils Absolute: 0.1 10*3/uL (ref 0–0.1)
EOS ABS: 0.2 10*3/uL (ref 0–0.7)
Eosinophils Relative: 1 %
HEMATOCRIT: 51.8 % (ref 40.0–52.0)
HEMOGLOBIN: 17.9 g/dL (ref 13.0–18.0)
LYMPHS ABS: 3.1 10*3/uL (ref 1.0–3.6)
Lymphocytes Relative: 22 %
MCH: 30.9 pg (ref 26.0–34.0)
MCHC: 34.5 g/dL (ref 32.0–36.0)
MCV: 89.6 fL (ref 80.0–100.0)
MONO ABS: 1 10*3/uL (ref 0.2–1.0)
MONOS PCT: 7 %
NEUTROS ABS: 9.7 10*3/uL — AB (ref 1.4–6.5)
NEUTROS PCT: 69 %
Platelets: 276 10*3/uL (ref 150–440)
RBC: 5.79 MIL/uL (ref 4.40–5.90)
RDW: 12.8 % (ref 11.5–14.5)
WBC: 14.1 10*3/uL — ABNORMAL HIGH (ref 3.8–10.6)

## 2016-08-31 LAB — CKMB (ARMC ONLY): CK, MB: 10.5 ng/mL — ABNORMAL HIGH (ref 0.5–5.0)

## 2016-08-31 LAB — TROPONIN I: Troponin I: <0.03 ng/mL (ref <0.03)

## 2016-08-31 MED ORDER — IPRATROPIUM-ALBUTEROL 0.5-2.5 (3) MG/3ML IN SOLN
3.0000 mL | Freq: Once | RESPIRATORY_TRACT | Status: AC
  Administered 2016-08-31: 3 mL via RESPIRATORY_TRACT

## 2016-08-31 MED ORDER — PREDNISONE 10 MG (21) PO TBPK: ORAL_TABLET | ORAL | 0 refills | Status: DC

## 2016-08-31 NOTE — ED Triage Notes (Signed)
Patient reports SOB and right arm cramps that started today while he was at work.  Patient reports his symptoms have improved.

## 2016-08-31 NOTE — ED Provider Notes (Signed)
[facility]    CSN: [id number] Arrival date & time: 08/31/16  1340     History   Chief Complaint Chief Complaint  Patient presents with  . Shortness of Breath    HPI Luke Barnes is a 21 y.o. male.   Patient is here because of shortness of breath. The shortness of breath started yesterday. He states he was at work lifting heavy sheet box yesterday when he started getting short of breath. He states the shortness of breath recurred again today. This time he did not have chest pain like he did yesterday state he drove here at our way to be seen and evaluated. He states this is not like his asthma but while he was here he started wheezing. Reports just feeling bad aching all over and not feeling well. States he never had anything like this before. He does not smoke but there are several people who smoke around him. He does have a history of asthma and uses Advair twice a day and albuterol inhaler as well. 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 No previous surgeries or operations. The chest pain only occurred yesterday. No pertinent family medical history relevant for today's visit other than his father having asthma as well no previous surgeries or operations no known drug allergies.                The history is provided by the patient. No language interpreter was used.  Shortness of Breath  Severity:  Severe Onset quality:  Sudden Duration:  2 days Timing:  Intermittent Progression:  Worsening Chronicity:  New Context: activity and strong odors   Relieved by:  Inhaler Worsened by:  Deep breathing, exertion, movement and coughing Associated symptoms: chest pain and wheezing   Associated symptoms: no cough   Associated symptoms comment:  Myalgia   Past Medical History:  Diagnosis Date  . Asthma     Patient Active Problem List   Diagnosis Date Noted  . Asthma, chronic 04/01/2012  . ADHD (attention deficit hyperactivity disorder) 04/01/2012     History reviewed. No pertinent surgical history.     Home Medications    Prior to Admission medications   Medication Sig Start Date End Date Taking? Authorizing Provider  albuterol (PROVENTIL HFA;VENTOLIN HFA) 108 (90 Base) MCG/ACT inhaler Inhale 2 puffs into the lungs every 6 (six) hours as needed for wheezing or shortness of breath. 05/17/16   Merlyn Albert, MD  Fluticasone-Salmeterol (ADVAIR DISKUS) 100-50 MCG/DOSE AEPB Inhale 1 puff into the lungs 2 (two) times daily. 05/09/16   Merlyn Albert, MD  predniSONE (STERAPRED UNI-PAK 21 TAB) 10 MG (21) TBPK tablet Sig 6 tablet day 1, 5 tablets day 2, 4 tablets day 3,,3tablets day 4, 2 tablets day 5, 1 tablet day 6 take all tablets orally 08/31/16   Hassan Rowan, MD    Family History Family History  Problem Relation Age of Onset  . Asthma Father     Social History Social History  Substance Use Topics  . Smoking status: Former Smoker    Packs/day: 0.01    Years: 1.00    Types: Cigarettes    Quit date: 01/04/2011  . Smokeless tobacco: Never Used  . Alcohol use No     Allergies   Patient has no known allergies.   Review of Systems Review of Systems  Respiratory: Positive for shortness of breath and wheezing. Negative for cough.   Cardiovascular: Positive for chest pain.  All other  systems reviewed and are negative.    Physical Exam Triage Vital Signs ED Triage Vitals  Enc Vitals Group     BP 08/31/16 1352 134/90     Pulse Rate 08/31/16 1352 (!) 101     Resp 08/31/16 1352 16     Temp 08/31/16 1352 98.6 F (37 C)     Temp Source 08/31/16 1352 Oral     SpO2 08/31/16 1352 98 %     Weight 08/31/16 1351 215 lb (97.5 kg)     Height 08/31/16 1351 5\' 4"  (1.626 m)     Head Circumference --      Peak Flow --      Pain Score 08/31/16 1351 0     Pain Loc --      Pain Edu? --      Excl. in GC? --    No data found.   Updated Vital Signs BP 134/90 (BP Location: Left Arm)   Pulse (!) 101   Temp 98.6 F (37 C)  (Oral)   Resp 16   Ht 5\' 4"  (1.626 m)   Wt 215 lb (97.5 kg)   SpO2 99%   BMI 36.90 kg/m   Visual Acuity Right Eye Distance:   Left Eye Distance:   Bilateral Distance:    Right Eye Near:   Left Eye Near:    Bilateral Near:     Physical Exam  Constitutional: He is oriented to person, place, and time. He appears well-developed and well-nourished. He appears ill.  Markedly sweaty  HENT:  Head: Normocephalic and atraumatic.  Right Ear: External ear normal.  Left Ear: External ear normal.  Nose: Nose normal.  Mouth/Throat: Oropharynx is clear and moist.  Eyes: Pupils are equal, round, and reactive to light. EOM are normal.  Neck: Normal range of motion. Neck supple.  Cardiovascular: Regular rhythm, S1 normal, S2 normal and normal heart sounds.  Tachycardia present.   Pulmonary/Chest: No respiratory distress. He has wheezes.  Abdominal: Soft.  Musculoskeletal: Normal range of motion.  Lymphadenopathy:    He has no cervical adenopathy.  Neurological: He is alert and oriented to person, place, and time.  Skin: Skin is warm.  Psychiatric: He has a normal mood and affect.  Vitals reviewed.    UC Treatments / Results  Labs (all labs ordered are listed, but only abnormal results are displayed) Labs Reviewed  CBC WITH DIFFERENTIAL/PLATELET - Abnormal; Notable for the following:       Result Value   WBC 14.1 (*)    Neutro Abs 9.7 (*)    All other components within normal limits  COMPREHENSIVE METABOLIC PANEL - Abnormal; Notable for the following:    Chloride 100 (*)    CO2 20 (*)    Glucose, Bld 111 (*)    BUN 24 (*)    Creatinine, Ser 1.25 (*)    Total Protein 8.6 (*)    Albumin 5.1 (*)    AST 44 (*)    All other components within normal limits  CKMB([facility] ONLY) - Abnormal; Notable for the following:    CK, MB 10.5 (*)    All other components within normal limits  CK - Abnormal; Notable for the following:    Total CK 790 (*)    All other components within normal  limits  URINALYSIS, COMPLETE (UACMP) WITH MICROSCOPIC - Abnormal; Notable for the following:    APPearance HAZY (*)    Specific Gravity, Urine >1.030 (*)    Bilirubin Urine SMALL (*)  Ketones, ur TRACE (*)    Protein, ur 100 (*)    Bacteria, UA FEW (*)    All other components within normal limits  FIBRIN DERIVATIVES D-DIMER ([facility] ONLY)  TROPONIN I    EKG  EKG Interpretation None      ED ECG REPORT I, Bernadetta Roell H, the attending physician, personally viewed and interpreted this ECG.   Date: 08/31/2016  EKG Time:15:09:55:  sinus tachycardia, normal EKG, normal sinus rh with marked sinus arrhythmiaus tracings  Axis: 48  Intervals:none  ST&T Change: none Radiology Dg Chest 2 View  Result Date: 08/31/2016 CLINICAL DATA:  Onset of shortness of breath and right arm cramping while at work today. History of asthma. Former smoker. EXAM: CHEST  2 VIEW COMPARISON:  Chest x-ray of January 11, 2015 FINDINGS: The lungs are mildly hyperinflated and clear. There is no pneumothorax or pleural effusion. The heart and pulmonary vascularity are normal. The mediastinum is normal in width. The bony thorax exhibits no acute abnormality. IMPRESSION: There is no active cardiopulmonary disease. Mild hyperinflation consistent with known reactive airway disease. Electronically Signed   By: David  [place] M.D.   On: 08/31/2016 15:29    Procedures Procedures (including critical care time)  Medications Ordered in UC Medications  ipratropium-albuterol (DUONEB) 0.5-2.5 (3) MG/3ML nebulizer solution 3 mL (3 mLs Nebulization Given 08/31/16 1457)    Results for orders placed or performed during the hospital encounter of 08/31/16  CBC with Differential  Result Value Ref Range   WBC 14.1 (H) 3.8 - 10.6 K/uL   RBC 5.79 4.40 - 5.90 MIL/uL   Hemoglobin 17.9 13.0 - 18.0 g/dL   HCT 16.1 09.6 - 04.5 %   MCV 89.6 80.0 - 100.0 fL   MCH 30.9 26.0 - 34.0 pg   MCHC 34.5 32.0 - 36.0 g/dL   RDW 40.9 81.1 - 91.4 %     Platelets 276 150 - 440 K/uL   Neutrophils Relative % 69 %   Neutro Abs 9.7 (H) 1.4 - 6.5 K/uL   Lymphocytes Relative 22 %   Lymphs Abs 3.1 1.0 - 3.6 K/uL   Monocytes Relative 7 %   Monocytes Absolute 1.0 0.2 - 1.0 K/uL   Eosinophils Relative 1 %   Eosinophils Absolute 0.2 0 - 0.7 K/uL   Basophils Relative 1 %   Basophils Absolute 0.1 0 - 0.1 K/uL  Comprehensive metabolic panel  Result Value Ref Range   Sodium 135 135 - 145 mmol/L   Potassium 3.9 3.5 - 5.1 mmol/L   Chloride 100 (L) 101 - 111 mmol/L   CO2 20 (L) 22 - 32 mmol/L   Glucose, Bld 111 (H) 65 - 99 mg/dL   BUN 24 (H) 6 - 20 mg/dL   Creatinine, Ser 7.82 (H) 0.61 - 1.24 mg/dL   Calcium 9.9 8.9 - 95.6 mg/dL   Total Protein 8.6 (H) 6.5 - 8.1 g/dL   Albumin 5.1 (H) 3.5 - 5.0 g/dL   AST 44 (H) 15 - 41 U/L   ALT 39 17 - 63 U/L   Alkaline Phosphatase 101 38 - 126 U/L   Total Bilirubin 0.9 0.3 - 1.2 mg/dL   GFR calc non Af Amer >60 >60 mL/min   GFR calc Af Amer >60 >60 mL/min   Anion gap 15 5 - 15  Fibrin derivatives D-Dimer ([facility] only)  Result Value Ref Range   Fibrin derivatives D-dimer ([facility]) 146.58 0.00 - 499.00  Troponin I  Result Value Ref Range  Troponin I <0.03 <0.03 ng/mL  CKMB([facility] only)  Result Value Ref Range   CK, MB 10.5 (H) 0.5 - 5.0 ng/mL  CK  Result Value Ref Range   Total CK 790 (H) 49 - 397 U/L  Urinalysis, Complete w Microscopic  Result Value Ref Range   Color, Urine YELLOW YELLOW   APPearance HAZY (A) CLEAR   Specific Gravity, Urine >1.030 (H) 1.005 - 1.030   pH 5.5 5.0 - 8.0   Glucose, UA NEGATIVE NEGATIVE mg/dL   Hgb urine dipstick NEGATIVE NEGATIVE   Bilirubin Urine SMALL (A) NEGATIVE   Ketones, ur TRACE (A) NEGATIVE mg/dL   Protein, ur 161100 (A) NEGATIVE mg/dL   Nitrite NEGATIVE NEGATIVE   Leukocytes, UA NEGATIVE NEGATIVE   Squamous Epithelial / LPF NONE SEEN NONE SEEN   WBC, UA 0-5 0 - 5 WBC/hpf   RBC / HPF 0-5 0 - 5 RBC/hpf   Bacteria, UA FEW (A) NONE SEEN   Hyaline Casts, UA  PRESENT    Granular Casts, UA PRESENT    Amorphous Crystal PRESENT    Initial Impression / Assessment and Plan / UC Course  I have reviewed the triage vital signs and the nursing notes.  Pertinent labs & imaging results that were available during my care of the patient were reviewed by me and considered in my medical decision making (see chart for details).  Clinical Course as of Sep 01 1702  Wed Aug 31, 2016  1551 CK, MB: (!) 10.5 [EW]  1552 CK, MB: (!) 10.5 [EW]  1552 Calcium: 9.9 [EW]  1559 AST: (!) 44 [EW]  1559 AST: (!) 44 [EW]  1559 AST: (!) 44 [EW]    Clinical Course User Index [EW] Hassan RowanWade, Deryck Hippler, MD   Patient EKG showed no worrisome arrhythmia chest x-ray was normal other than showing what appears to reactive airway disease his lab no showed elevated CK and CK-MB showed think is secondary to dehydration since troponin was normal and d-dimer was also normal. Creatinine was slightly elevated and BUN was slightly elevated which would indicate morbid dehydration from the muscle breakdown causing some mild renal changes. Strongly suggest stress to him the need to drink clear fluids water no given for school for today work note for Thursday and Friday strongly recommend follow-up with his PCP on Monday to get CK and CK-MBs redrawn.  Final Clinical Impressions(s) / UC Diagnoses   Final diagnoses:  Dyspnea, unspecified type  Exacerbation of asthma, unspecified asthma severity, unspecified whether persistent  Elevated CK-MB level  Dehydration    New Prescriptions Discharge Medication List as of 08/31/2016  4:49 PM    START taking these medications   Details  predniSONE (STERAPRED UNI-PAK 21 TAB) 10 MG (21) TBPK tablet Sig 6 tablet day 1, 5 tablets day 2, 4 tablets day 3,,3tablets day 4, 2 tablets day 5, 1 tablet day 6 take all tablets orally, Print       Note: This dictation was prepared with Dragon dictation along with smaller phrase technology. Any transcriptional errors that  result from this process are unintentional. Controlled Substance Prescriptions [place] Controlled Substance Registry consulted? Not Applicable   Hassan RowanWade, Cammi Consalvo, MD 08/31/16 [phone]

## 2016-08-31 NOTE — Discharge Instructions (Signed)
Due to the elevated muscle enzymes found in the blistering strongly suggest plantar fluids especially water 4872 hours also would strongly suggest follow-up PCP on Monday to have blood work redrawn rechecked to make sure the CK and CK-MBs have gone down

## 2016-09-18 ENCOUNTER — Other Ambulatory Visit: Payer: Self-pay | Admitting: Family Medicine

## 2016-09-20 ENCOUNTER — Telehealth: Payer: Self-pay | Admitting: *Deleted

## 2016-09-20 ENCOUNTER — Other Ambulatory Visit: Payer: Self-pay | Admitting: *Deleted

## 2016-09-20 MED ORDER — FLUTICASONE-SALMETEROL 100-50 MCG/DOSE IN AEPB: 1.0000 | INHALATION_SPRAY | Freq: Two times a day (BID) | RESPIRATORY_TRACT | 1 refills | Status: DC

## 2016-09-20 MED ORDER — ALBUTEROL SULFATE HFA 108 (90 BASE) MCG/ACT IN AERS: INHALATION_SPRAY | RESPIRATORY_TRACT | 1 refills | Status: DC

## 2016-09-20 MED ORDER — FLUTICASONE-SALMETEROL 100-50 MCG/DOSE IN AEPB
1.0000 | INHALATION_SPRAY | Freq: Two times a day (BID) | RESPIRATORY_TRACT | 5 refills | Status: DC
Start: 2016-09-20 — End: 2016-09-20

## 2016-09-20 NOTE — Telephone Encounter (Signed)
Received PA for pt's advair. Worked on PA and received a fax from [facility] that the drug is on the list of covered drugs and does not required a PA. Called [facility] to see if drug would go through and they state it will not it needs to go to mail order. Med sent to [facility]. Tried to call to  Notify pt. Voicemail not set up.

## 2016-09-20 NOTE — Telephone Encounter (Signed)
Received request from [facility] stating pt was requesting medication from mailorder. meds sent to [facility] per dr Brett Canales.

## 2016-10-11 ENCOUNTER — Ambulatory Visit
Admission: EM | Admit: 2016-10-11 | Discharge: 2016-10-11 | Disposition: A | Discharge status: Home / Self Care | Payer: Self-pay | Attending: Family Medicine | Admitting: Family Medicine

## 2016-10-11 DIAGNOSIS — W57XXXA Bitten or stung by nonvenomous insect and other nonvenomous arthropods, initial encounter: Secondary | ICD-10-CM

## 2016-10-11 DIAGNOSIS — L03115 Cellulitis of right lower limb: Secondary | ICD-10-CM

## 2016-10-11 DIAGNOSIS — L03116 Cellulitis of left lower limb: Secondary | ICD-10-CM

## 2016-10-11 MED ORDER — TETANUS-DIPHTH-ACELL PERTUSSIS 5-2.5-18.5 LF-MCG/0.5 IM SUSP
0.5000 mL | Freq: Once | INTRAMUSCULAR | Status: AC
  Administered 2016-10-11: 0.5 mL via INTRAMUSCULAR

## 2016-10-11 MED ORDER — SULFAMETHOXAZOLE-TRIMETHOPRIM 800-160 MG PO TABS: 2.0000 | ORAL_TABLET | Freq: Two times a day (BID) | ORAL | 0 refills | Status: AC

## 2016-10-11 MED ORDER — MUPIROCIN 2 % EX OINT: TOPICAL_OINTMENT | CUTANEOUS | 0 refills | Status: DC

## 2016-10-11 NOTE — Discharge Instructions (Signed)
Take medication as prescribed. Keep clean. Rest. Drink plenty of fluids. Monitor closely.   Follow up with your primary care physician this week. Return to Urgent care as needed. Proceed directly to Emergency room for increased redness, swelling, pain, fevers, or worsening concerns.

## 2016-10-11 NOTE — ED Triage Notes (Signed)
Patient complains of skin ulcer or abscess on left ankle. Patient states that it originated about 1 week ago as a bug bite. Patient states that he has noticed another area of concern on right ankle.

## 2016-10-11 NOTE — ED Provider Notes (Addendum)
[facility] ____________________________________________  Time seen: Approximately 4:55 PM  I have reviewed the triage vital signs and the nursing notes.   HISTORY  Chief Complaint Skin Ulcer   HPI Luke Barnes is a 21 y.o. male presenting for evaluation of skin areas to bilateral feet from recent insect bites. Patient reports approximately 2 weeks ago he had been outside a lot and noticed that he had some bug bites there are multiple clustered to both his ankles and feet. Denies ever seeing any insects. Denies any tick bite or tick attachments. Patient reports approximately half week later he began to have some open sores, states that he was scratching the area and lot which maybe contributed to it, as it is very itchy. States left ankle area has been having some drainage in the other day he expressed a large amount of drainage from the area. Denies any cut or unknown other break in skin. Denies any fall, injury, paresthesias, decreased range of motion. States that over the last week the area is gradually increased in redness, and states over last 2 days the left ankle area has looked more red and has had some drainage. Patient denies any pain at this time. States no decrease range of motion, difficulty walking or any other changes. States has been cleaning the area multiple times at home and keeping clean as well as applying topical triple antibiotic over-the-counter. Denies accompanying fevers, weakness, chest pain, shortness breath, abdominal pain. Unsure of last tetanus immunization. 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 Denies any recent MRSA infections. Denies recent sickness. Denies recent antibiotic use. Denies others around him with similar. Denies renal insufficiency. Denies cardiac history.   Past Medical History:  Diagnosis Date  . Asthma     Patient Active Problem List   Diagnosis Date Noted  . Asthma, chronic 04/01/2012  . ADHD  (attention deficit hyperactivity disorder) 04/01/2012    History reviewed. No pertinent surgical history.   No current facility-administered medications for this encounter.   Current Outpatient Prescriptions:  .  albuterol (VENTOLIN HFA) 108 (90 Base) MCG/ACT inhaler, INHALE 2 PUFFS BY MOUTH EVERY 6 HOURS AS NEEDED FOR WHEEZING AND FOR SHORTNESS OF BREATH, Disp: 18 each, Rfl: 1 .  Fluticasone-Salmeterol (ADVAIR DISKUS) 100-50 MCG/DOSE AEPB, Inhale 1 puff into the lungs 2 (two) times daily., Disp: 60 each, Rfl: 1 .  mupirocin ointment (BACTROBAN) 2 %, Apply two times a day for 10 days., Disp: 22 g, Rfl: 0 .  predniSONE (STERAPRED UNI-PAK 21 TAB) 10 MG (21) TBPK tablet, Sig 6 tablet day 1, 5 tablets day 2, 4 tablets day 3,,3tablets day 4, 2 tablets day 5, 1 tablet day 6 take all tablets orally, Disp: 21 tablet, Rfl: 0 .  sulfamethoxazole-trimethoprim (BACTRIM DS,SEPTRA DS) 800-160 MG tablet, Take 2 tablets by mouth 2 (two) times daily., Disp: 40 tablet, Rfl: 0  Allergies Patient has no known allergies.  Family History  Problem Relation Age of Onset  . Asthma Father   DEnies others at home with similar.  Social History Social History  Substance Use Topics  . Smoking status: Former Smoker    Packs/day: 0.01    Years: 1.00    Types: Cigarettes    Quit date: 01/04/2011  . Smokeless tobacco: Never Used  . Alcohol use No    Review of Systems Constitutional: No fever/chills Cardiovascular: Denies chest pain. Respiratory: Denies shortness of breath. Gastrointestinal: No abdominal pain.  No nausea, no vomiting.  Musculoskeletal: Negative for back pain. Skin: As above.   ____________________________________________   PHYSICAL EXAM:  VITAL SIGNS: ED Triage Vitals  Enc Vitals Group     BP 10/11/16 1642 139/77     Pulse Rate 10/11/16 1642 92     Resp 10/11/16 1642 18     Temp 10/11/16 1642 98.4 F (36.9 C)     Temp Source 10/11/16 1642 Oral     SpO2 10/11/16 1642 100 %      Weight 10/11/16 1641 215 lb (97.5 kg)     Height 10/11/16 1641  (1.626 m)     Head Circumference --      Peak Flow --      Pain Score 10/11/16 1641 0     Pain Loc --      Pain Edu? --      Excl. in GC? --     Constitutional: Alert and oriented. Well appearing and in no acute distress. Cardiovascular: Normal rate, regular rhythm. Grossly normal heart sounds.  Good peripheral circulation. Respiratory: Normal respiratory effort without tachypnea nor retractions. Breath sounds are clear and equal bilaterally. No wheezes, rales, rhonchi. Musculoskeletal:  Steady gait. Bilateral pedal pulses equal and easily palpated. Bilateral lower extremities nontender and no edema noted, see below. Neurologic:  Normal speech and language. Speech is normal. No gait instability.  Skin:  Skin is warm, dry.  Except:  Bilateral ankles and plantar feet multiple clustered erythematous papules mostly appear to be resolving, pruritic, nontender, bilateral feet and ankles full range of motion present, no pain with range of motion. Left anterior ankle erythematous area approximately 3 x 3 cm indurated, non-pointing, no fluctuance, active purulent drainage, nontender to palpation, full range of motion present, no pain with palpation or with weightbearing. Right lateral posterior foot area of approximately 1x1cm erythematous mildly indurated area, no fluctuance, no drainage, nontender. Psychiatric: Mood and affect are normal. Speech and behavior are normal. Patient exhibits appropriate insight and judgment   ___________________________________________   LABS (all labs ordered are listed, but only abnormal results are displayed)  Labs Reviewed  AEROBIC CULTURE (SUPERFICIAL SPECIMEN)   ____________________________________________   PROCEDURES Procedures     INITIAL IMPRESSION / ASSESSMENT AND PLAN / ED COURSE  Pertinent labs & imaging results that were available during my care of the patient were reviewed  by me and considered in my medical decision making (see chart for details).  Very well-appearing patient. No acute distress. Clinical appearance consistent of recent insect bite such as chiggers or gets to bilateral ankles and feet. Tetanus immunization updated. Wound culture obtained. Patient with full range of motion present and no bony tenderness and no tenderness, will defer x-ray at this time due to cost per patient. Also will defer other laboratory studies and IM antibiotics due to cost. Will start patient on double strength Bactrim 2 tablets twice a day as well as topical Bactroban. Encouraged good wound care, keeping clean and elevating. Discussed very strict follow-up and return parameters. Also discussed for any fever, increased swelling, pain, range of motion issues, proceed directly to emergency room for further evaluation.Discussed indication, risks and benefits of medications with patient.   Discussed follow up with Primary care physician this week. Discussed follow up and return parameters including no resolution or any worsening concerns. Patient verbalized understanding and agreed to plan.   ____________________________________________   FINAL CLINICAL IMPRESSION(S) / ED DIAGNOSES  Final diagnoses:  Cellulitis of left ankle  Cellulitis of right foot  Insect bite, initial  encounter     Discharge Medication List as of 10/11/2016  5:20 PM    START taking these medications   Details  mupirocin ointment (BACTROBAN) 2 % Apply two times a day for 10 days., Normal    sulfamethoxazole-trimethoprim (BACTRIM DS,SEPTRA DS) 800-160 MG tablet Take 2 tablets by mouth 2 (two) times daily., Starting Tue 10/11/2016, Until Fri 10/21/2016, Normal        Note: This dictation was prepared with Dragon dictation along with smaller phrase technology. Any transcriptional errors that result from this process are unintentional.         Renford Dills, NP 10/11/16 1851    Renford Dills,  NP 10/11/16 [phone]

## 2016-10-14 LAB — AEROBIC CULTURE W GRAM STAIN (SUPERFICIAL SPECIMEN)

## 2016-10-14 LAB — AEROBIC CULTURE  (SUPERFICIAL SPECIMEN)

## 2016-10-21 ENCOUNTER — Other Ambulatory Visit: Payer: Self-pay | Admitting: *Deleted

## 2016-10-21 MED ORDER — ALBUTEROL SULFATE HFA 108 (90 BASE) MCG/ACT IN AERS: INHALATION_SPRAY | RESPIRATORY_TRACT | 1 refills | Status: DC

## 2017-02-12 ENCOUNTER — Other Ambulatory Visit: Payer: Self-pay

## 2017-02-12 ENCOUNTER — Emergency Department: Payer: Self-pay

## 2017-02-12 ENCOUNTER — Emergency Department
Admission: EM | Admit: 2017-02-12 | Discharge: 2017-02-12 | Disposition: A | Discharge status: Home / Self Care | Payer: Self-pay | Attending: Emergency Medicine | Admitting: Emergency Medicine

## 2017-02-12 DIAGNOSIS — Z87891 Personal history of nicotine dependence: Secondary | ICD-10-CM | POA: Insufficient documentation

## 2017-02-12 DIAGNOSIS — Z79899 Other long term (current) drug therapy: Secondary | ICD-10-CM | POA: Insufficient documentation

## 2017-02-12 DIAGNOSIS — J4520 Mild intermittent asthma, uncomplicated: Secondary | ICD-10-CM

## 2017-02-12 MED ORDER — METHYLPREDNISOLONE SODIUM SUCC 125 MG IJ SOLR
80.0000 mg | Freq: Once | INTRAMUSCULAR | Status: AC
  Administered 2017-02-12: 80 mg via INTRAMUSCULAR

## 2017-02-12 MED ORDER — METHYLPREDNISOLONE 4 MG PO TBPK: ORAL_TABLET | ORAL | 0 refills | Status: DC

## 2017-02-12 MED ORDER — ALBUTEROL SULFATE HFA 108 (90 BASE) MCG/ACT IN AERS: 2.0000 | INHALATION_SPRAY | Freq: Four times a day (QID) | RESPIRATORY_TRACT | 2 refills | Status: DC | PRN

## 2017-02-12 MED ORDER — METHYLPREDNISOLONE SODIUM SUCC 125 MG IJ SOLR
80.0000 mg | Freq: Once | INTRAMUSCULAR | Status: DC
  Filled 2017-02-12: qty 2

## 2017-02-12 MED ORDER — IPRATROPIUM-ALBUTEROL 0.5-2.5 (3) MG/3ML IN SOLN
3.0000 mL | Freq: Once | RESPIRATORY_TRACT | Status: AC
  Administered 2017-02-12: 3 mL via RESPIRATORY_TRACT
  Filled 2017-02-12: qty 3

## 2017-02-12 NOTE — ED Triage Notes (Addendum)
Pt reports that he typically uses his inhaler once to twice a day and states that he used it up a couple days ago and has been having difficulty with his asthma since. Pt reports feeling tight and wheezy, denies cough. Pt has wheezing noted at right upper lobe. No distress noted

## 2017-02-12 NOTE — ED Provider Notes (Signed)
[facility] Emergency Department Provider Note   ____________________________________________   First MD Initiated Contact with Patient 02/12/17 2309     (approximate)  I have reviewed the triage vital signs and the nursing notes.   HISTORY  Chief Complaint Asthma    HPI Luke Barnes is a 22 y.o. male patient complain of chest tightness and wheezing today.  Patient normally uses an inhaler once or twice a day but has run out of medication.  Patient denies cough with this complaint.  Denies any chest pain.  Patient asking for sample of albuterol to take home. Patient appears in no acute distress and talking in complete sentences. Past Medical History:  Diagnosis Date  . Asthma     Patient Active Problem List   Diagnosis Date Noted  . Asthma, chronic 04/01/2012  . ADHD (attention deficit hyperactivity disorder) 04/01/2012    No past surgical history on file.  Prior to Admission medications   Medication Sig Start Date End Date Taking? Authorizing Provider  albuterol (PROVENTIL HFA;VENTOLIN HFA) 108 (90 Base) MCG/ACT inhaler Inhale 2 puffs into the lungs every 6 (six) hours as needed for wheezing or shortness of breath. 02/12/17   Joni Reining, PA-C  albuterol (VENTOLIN HFA) 108 (90 Base) MCG/ACT inhaler INHALE 2 PUFFS BY MOUTH EVERY 6 HOURS AS NEEDED FOR WHEEZING AND FOR SHORTNESS OF BREATH 10/21/16   Merlyn Albert, MD  Fluticasone-Salmeterol (ADVAIR DISKUS) 100-50 MCG/DOSE AEPB Inhale 1 puff into the lungs 2 (two) times daily. 09/20/16   Merlyn Albert, MD  methylPREDNISolone (MEDROL DOSEPAK) 4 MG TBPK tablet Take Tapered dose as directed 02/12/17   Joni Reining, PA-C  mupirocin ointment (BACTROBAN) 2 % Apply two times a day for 10 days. 10/11/16   Renford Dills, NP  predniSONE (STERAPRED UNI-PAK 21 TAB) 10 MG (21) TBPK tablet Sig 6 tablet day 1, 5 tablets day 2, 4 tablets day 3,,3tablets day 4, 2 tablets day 5, 1 tablet day 6 take all  tablets orally 08/31/16   Hassan Rowan, MD    Allergies Patient has no known allergies.  Family History  Problem Relation Age of Onset  . Asthma Father     Social History Social History   Tobacco Use  . Smoking status: Former Smoker    Packs/day: 0.01    Years: 1.00    Pack years: 0.01    Types: Cigarettes    Last attempt to quit: 01/04/2011    Years since quitting: 6.1  . Smokeless tobacco: Never Used  Substance Use Topics  . Alcohol use: No  . Drug use: No    Review of Systems Constitutional: No fever/chills Eyes: No visual changes. ENT: No sore throat. Cardiovascular: Denies chest pain. Respiratory: Denies shortness of breath.  Wheezing Gastrointestinal: No abdominal pain.  No nausea, no vomiting.  No diarrhea.  No constipation. Genitourinary: Negative for dysuria. Musculoskeletal: Negative for back pain. Skin: Negative for rash. Neurological: Negative for headaches, focal weakness or numbness. Psychiatric:ADHD ____________________________________________   PHYSICAL EXAM:  VITAL SIGNS: ED Triage Vitals [02/12/17 2235]  Enc Vitals Group     BP 127/80     Pulse Rate (!) 108     Resp 18     Temp 97.7 F (36.5 C)     Temp Source Oral     SpO2 96 %     Weight 215 lb (97.5 kg)     Height 5\' 11"  (1.803 m)     Head Circumference  Peak Flow      Pain Score      Pain Loc      Pain Edu?      Excl. in GC?    Constitutional: Alert and oriented. Well appearing and in no acute distress. Nose: No congestion/rhinnorhea. Mouth/Throat: Mucous membranes are moist.  Oropharynx non-erythematous. Neck: No stridor.  Hematological/Lymphatic/Immunilogical: No cervical lymphadenopathy. Cardiovascular: Normal rate, regular rhythm. Grossly normal heart sounds.  Good peripheral circulation. Respiratory: Normal respiratory effort.  No retractions. Upper lobe wheezing Neurologic:  Normal speech and language. No gross focal neurologic deficits are appreciated. No gait  instability. Skin:  Skin is warm, dry and intact. No rash noted. Psychiatric: Mood and affect are normal. Speech and behavior are normal.  ____________________________________________   LABS (all labs ordered are listed, but only abnormal results are displayed)  Labs Reviewed - No data to display ____________________________________________  EKG   ____________________________________________  RADIOLOGY  ED MD interpretation: No acute findings on chest x-ray  Official radiology report(s): Dg Chest 2 View  Result Date: 02/12/2017 CLINICAL DATA:  Acute onset of generalized chest tightness and wheezing. EXAM: CHEST  2 VIEW COMPARISON:  Chest radiograph performed 08/31/2016 FINDINGS: The lungs are well-aerated and clear. There is no evidence of focal opacification, pleural effusion or pneumothorax. The heart is normal in size; the mediastinal contour is within normal limits. No acute osseous abnormalities are seen. IMPRESSION: No acute cardiopulmonary process seen. Electronically Signed   By: Roanna RaiderJeffery  Chang M.D.   On: 02/12/2017 23:47    ____________________________________________   PROCEDURES  Procedure(s) performed: None  Procedures  Critical Care performed: No  ____________________________________________   INITIAL IMPRESSION / ASSESSMENT AND PLAN / ED COURSE  As part of my medical decision making, I reviewed the following data within the electronic MEDICAL RECORD NUMBER    Mild asthma exacerbation without complications.  Lungs are now clear to auscultation status post 1 DuoNeb treatment and Solu-Medrol IM.  Patient given discharge care instruction prescription for Ventolin and Medrol Dosepak.  Advised follow-up PCP.     ____________________________________________   FINAL CLINICAL IMPRESSION(S) / ED DIAGNOSES  Final diagnoses:  Mild intermittent asthma without complication     ED Discharge Orders        Ordered    albuterol (PROVENTIL HFA;VENTOLIN HFA) 108  (90 Base) MCG/ACT inhaler  Every 6 hours PRN     02/12/17 2345    methylPREDNISolone (MEDROL DOSEPAK) 4 MG TBPK tablet     02/12/17 2352       Note:  This document was prepared using Dragon voice recognition software and may include unintentional dictation errors.    Joni ReiningSmith, Athanasius Kesling K, PA-C 02/12/17 2352    Joni ReiningSmith, Jairy Angulo K, PA-C 02/12/17 [id number]    Phineas SemenGoodman, Graydon, MD 02/12/17 [phone]

## 2017-02-12 NOTE — ED Notes (Signed)
Pt reports history of asthma; is out of is inhaler; expiratory wheezes to right side; talking in complete coherent sentences

## 2017-04-24 IMAGING — CR DG CHEST 1V PORT
1 series · 1 of 1 positions shown · non-contrast
Comparison: Chest x-ray dated 03/28/2014

CLINICAL DATA: Shortness of breath since 3 a.m. last night. History
of smoking. History of asthma.

EXAM:
PORTABLE CHEST 1 VIEW

[ap]
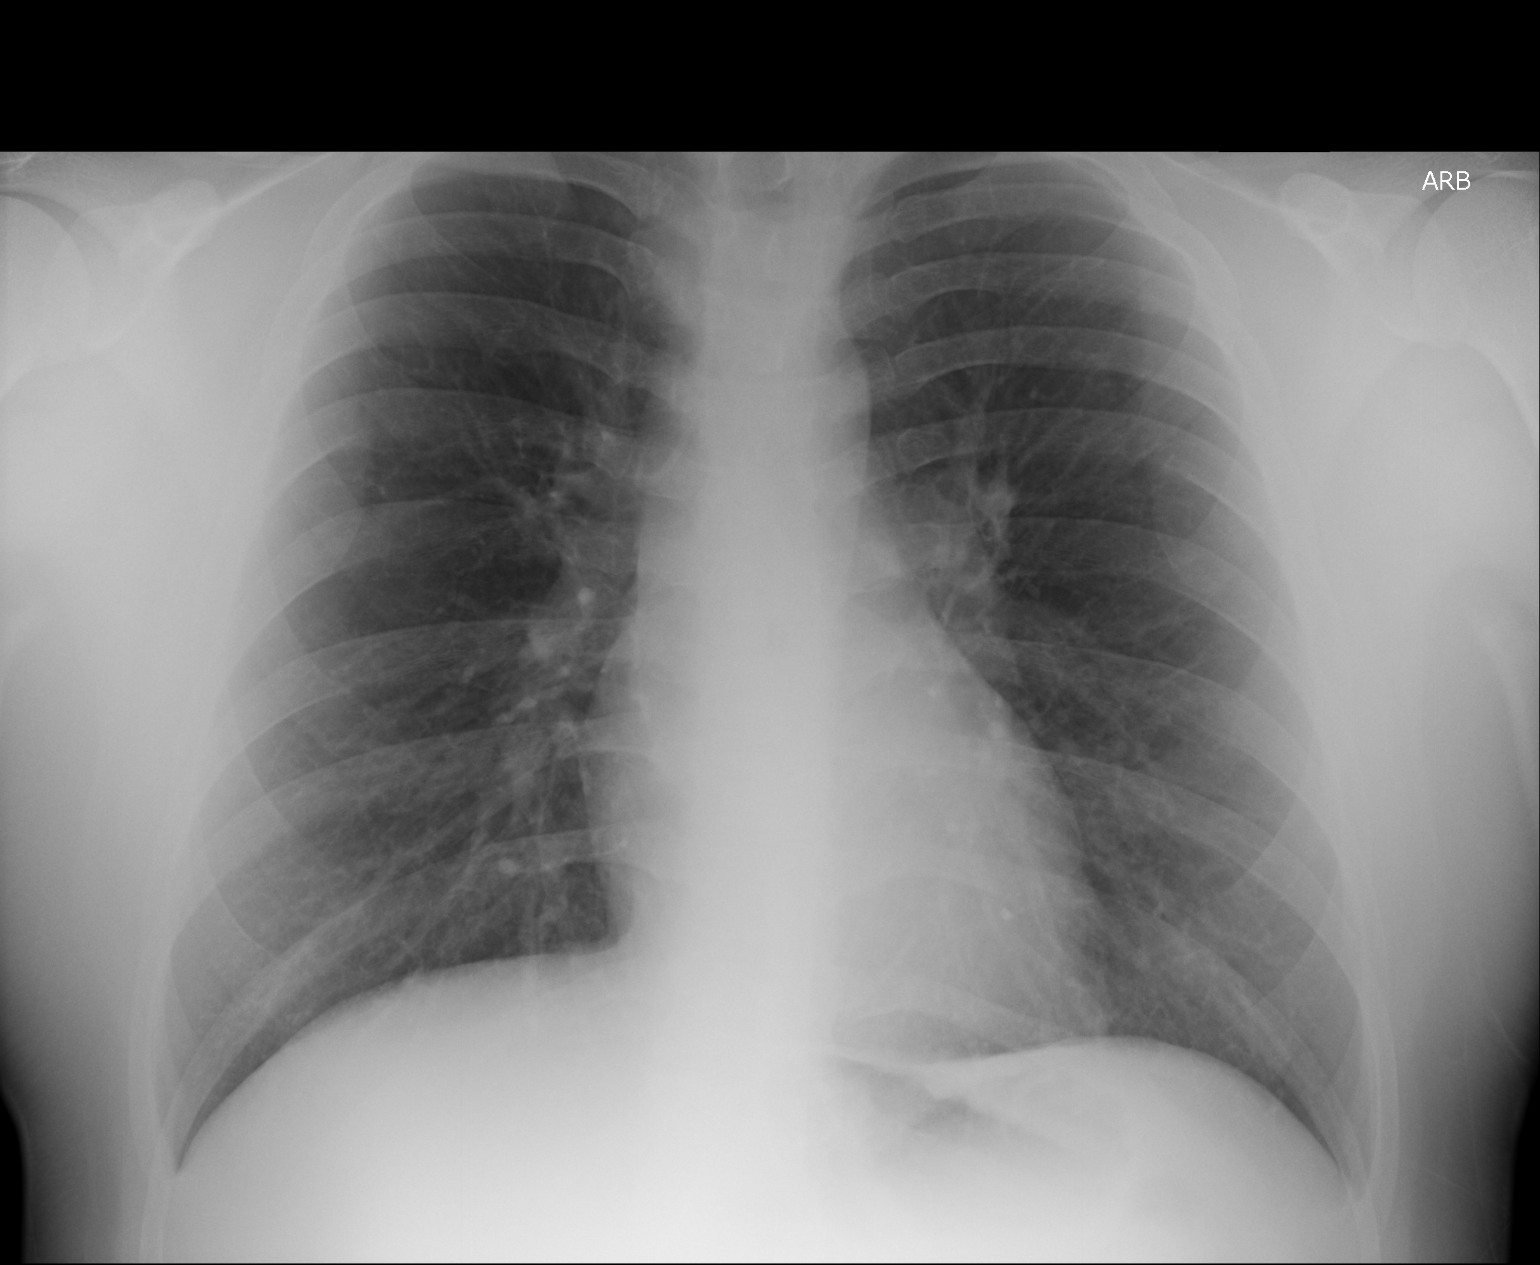

[1 of 1 positions shown; findings below may reference images not displayed]

FINDINGS: The heart size and mediastinal contours are within normal limits.
Both lungs are clear. No evidence of pneumonia. No pleural effusion.
Lung volumes are normal. The visualized skeletal structures are
unremarkable.
IMPRESSION: Lungs are clear and there is no evidence of acute cardiopulmonary
abnormality.

## 2017-06-29 ENCOUNTER — Other Ambulatory Visit: Payer: Self-pay

## 2017-06-29 ENCOUNTER — Ambulatory Visit
Admission: EM | Admit: 2017-06-29 | Discharge: 2017-06-29 | Disposition: A | Discharge status: Home / Self Care | Payer: Self-pay | Attending: Family Medicine | Admitting: Family Medicine

## 2017-06-29 ENCOUNTER — Encounter: Payer: Self-pay | Admitting: Emergency Medicine

## 2017-06-29 DIAGNOSIS — H61892 Other specified disorders of left external ear: Secondary | ICD-10-CM

## 2017-06-29 NOTE — Discharge Instructions (Signed)
Exam was normal.  No need to worry.  Take care  Dr. Adriana Simasook

## 2017-06-29 NOTE — ED Provider Notes (Signed)
[facility]    CSN: [id number] Arrival date & time: 06/29/17  1723  History   Chief Complaint Chief Complaint  Patient presents with  . Foreign Body in Ear    left   HPI  22 year old male presents with the above complaint.  Patient reports that today he developed a foreign body sensation in his left ear.  He states that he is outside constantly.  He states that he feels a scratchy sensation and possible movement inside of his ear.  No significant pain.  He is concerned about an insect or foreign body in his ear.  He does not recall an event of a bug flying into his ear.  No medications or interventions tried.  No other associated symptoms.  No other complaints.  Past Medical History:  Diagnosis Date  . Asthma    Patient Active Problem List   Diagnosis Date Noted  . Asthma, chronic 04/01/2012  . ADHD (attention deficit hyperactivity disorder) 04/01/2012   Past Surgical History:  Procedure Laterality Date  . NO PAST SURGERIES     Home Medications    Prior to Admission medications   Medication Sig Start Date End Date Taking? Authorizing Provider  albuterol (PROVENTIL HFA;VENTOLIN HFA) 108 (90 Base) MCG/ACT inhaler Inhale 2 puffs into the lungs every 6 (six) hours as needed for wheezing or shortness of breath. 02/12/17  Yes Joni ReiningSmith, Ronald K, PA-C  albuterol (VENTOLIN HFA) 108 (90 Base) MCG/ACT inhaler INHALE 2 PUFFS BY MOUTH EVERY 6 HOURS AS NEEDED FOR WHEEZING AND FOR SHORTNESS OF BREATH 10/21/16   Merlyn AlbertLuking, William S, MD  Fluticasone-Salmeterol (ADVAIR DISKUS) 100-50 MCG/DOSE AEPB Inhale 1 puff into the lungs 2 (two) times daily. 09/20/16   Merlyn AlbertLuking, William S, MD    Family History Family History  Problem Relation Age of Onset  . Asthma Father     Social History Social History   Tobacco Use  . Smoking status: Former Smoker    Packs/day: 0.01    Years: 1.00    Pack years: 0.01    Types: Cigarettes    Last attempt to quit: 01/04/2011    Years since quitting:  6.4  . Smokeless tobacco: Never Used  Substance Use Topics  . Alcohol use: Yes  . Drug use: No     Allergies   Patient has no known allergies.   Review of Systems Review of Systems  Constitutional: Negative.   HENT: Negative for ear discharge, ear pain and hearing loss.        Foreign body sensation, left ear.   Physical Exam Triage Vital Signs ED Triage Vitals  Enc Vitals Group     BP 06/29/17 1805 116/72     Pulse Rate 06/29/17 1805 (!) 101     Resp 06/29/17 1805 16     Temp 06/29/17 1805 98.3 F (36.8 C)     Temp Source 06/29/17 1805 Oral     SpO2 06/29/17 1805 98 %     Weight 06/29/17 1806 206 lb (93.4 kg)     Height 06/29/17 1806 5\' 11"  (1.803 m)     Head Circumference --      Peak Flow --      Pain Score 06/29/17 1805 0     Pain Loc --      Pain Edu? --      Excl. in GC? --    Updated Vital Signs BP 116/72 (BP Location: Left Arm)   Pulse (!) 101   Temp 98.3 F (36.8  C) (Oral)   Resp 16   Ht 5\' 11"  (1.803 m)   Wt 206 lb (93.4 kg)   SpO2 98%   BMI 28.73 kg/m   Physical Exam  Constitutional: He is oriented to person, place, and time. He appears well-developed. No distress.  HENT:  Normal canals and TMs bilaterally.  No appreciable foreign body.  Cardiovascular: Normal rate and regular rhythm.  Pulmonary/Chest: Effort normal and breath sounds normal. He has no wheezes. He has no rales.  Neurological: He is alert and oriented to person, place, and time.  Psychiatric: He has a normal mood and affect. His behavior is normal.  Nursing note and vitals reviewed.  UC Treatments / Results  Labs (all labs ordered are listed, but only abnormal results are displayed) Labs Reviewed - No data to display  EKG None  Radiology No results found.  Procedures Procedures (including critical care time)  Medications Ordered in UC Medications - No data to display  Initial Impression / Assessment and Plan / UC Course  I have reviewed the triage vital signs and  the nursing notes.  Pertinent labs & imaging results that were available during my care of the patient were reviewed by me and considered in my medical decision making (see chart for details).    22 year old male presents with a foreign body sensation in his left ear.  Normal exam.  Ear was flushed to ensure no occult foreign body.  No foreign body after flushing.  Supportive care.  Final Clinical Impressions(s) / UC Diagnoses   Final diagnoses:  Foreign body sensation in ear canal, left     Discharge Instructions     Exam was normal.  No need to worry.  Take care  Dr. Adriana Simas    ED Prescriptions    None     Controlled Substance Prescriptions [place] Controlled Substance Registry consulted? Not Applicable   Tommie Sams, DO 06/29/17 1857

## 2017-06-29 NOTE — ED Triage Notes (Signed)
Pt is here today for possible a bug in his left ear. He reports that he feels like something is scratching and moving inside of his ear. He says his ear does not hurt.

## 2017-07-05 ENCOUNTER — Other Ambulatory Visit: Payer: Self-pay | Admitting: Family Medicine

## 2017-07-05 NOTE — Telephone Encounter (Signed)
Ok times one, needs o v 

## 2017-08-17 ENCOUNTER — Other Ambulatory Visit: Payer: Self-pay | Admitting: Family Medicine

## 2017-10-02 ENCOUNTER — Encounter: Payer: Self-pay | Admitting: Family Medicine

## 2017-10-02 ENCOUNTER — Ambulatory Visit (INDEPENDENT_AMBULATORY_CARE_PROVIDER_SITE_OTHER): Payer: BLUE CROSS/BLUE SHIELD | Admitting: Family Medicine

## 2017-10-02 VITALS — BP 120/76 | HR 99 | Temp 97.4°F | Ht 71.0 in | Wt 207.0 lb

## 2017-10-02 DIAGNOSIS — J4520 Mild intermittent asthma, uncomplicated: Secondary | ICD-10-CM | POA: Diagnosis not present

## 2017-10-02 MED ORDER — ALBUTEROL SULFATE HFA 108 (90 BASE) MCG/ACT IN AERS: INHALATION_SPRAY | RESPIRATORY_TRACT | 1 refills | Status: DC

## 2017-10-02 MED ORDER — PREDNISONE 20 MG PO TABS: ORAL_TABLET | ORAL | 0 refills | Status: DC

## 2017-10-02 MED ORDER — CEFPROZIL 500 MG PO TABS: 500.0000 mg | ORAL_TABLET | Freq: Two times a day (BID) | ORAL | 0 refills | Status: AC

## 2017-10-02 MED ORDER — FLUTICASONE-SALMETEROL 100-50 MCG/DOSE IN AEPB: 1.0000 | INHALATION_SPRAY | Freq: Two times a day (BID) | RESPIRATORY_TRACT | 1 refills | Status: DC

## 2017-10-02 NOTE — Progress Notes (Signed)
   Subjective:    Patient ID: Luke Barnes, male    DOB: 1995-09-14, 22 y.o.   MRN: [id number]  HPI   Patient is here today with complaints of an asthma flare.He states it has gotten worse in the last few weeks.He states he takes advair and ventolin.    He states he went to urgent care for this a few weeks ago and they gave him prednisone.    Had wheezing and sob with the breathing     Patient admits only fair compliance with his Advair lately.    Review of Systems No headache, no major weight loss or weight gain, no chest pain no back pain abdominal pain no change in bowel habits complete ROS otherwise negative     Objective:   Physical Exam  Alert vitals stable, NAD. Blood pressure good on repeat. HEENT normal. Lungs bilateral wheezes.  No tachypnea.  R. Heart regular rate and rhythm.      Assessment & Plan:  Impression substantial exacerbation of asthma.  Will give him a round of prednisone and Advair compliance discussed and encouraged.  Patient brought issues of chronic concerns in both the genital and rectal area, encouraged him to return for wellness and we will tell him to that also

## 2017-10-05 ENCOUNTER — Other Ambulatory Visit: Payer: Self-pay

## 2017-10-05 MED ORDER — ALBUTEROL SULFATE HFA 108 (90 BASE) MCG/ACT IN AERS: 2.0000 | INHALATION_SPRAY | Freq: Four times a day (QID) | RESPIRATORY_TRACT | 2 refills | Status: DC | PRN

## 2017-10-05 MED ORDER — ALBUTEROL SULFATE HFA 108 (90 BASE) MCG/ACT IN AERS: INHALATION_SPRAY | RESPIRATORY_TRACT | 1 refills | Status: DC

## 2017-10-05 MED ORDER — FLUTICASONE-SALMETEROL 100-50 MCG/DOSE IN AEPB: 1.0000 | INHALATION_SPRAY | Freq: Two times a day (BID) | RESPIRATORY_TRACT | 1 refills | Status: DC

## 2017-10-25 ENCOUNTER — Encounter: Payer: Self-pay | Admitting: Family Medicine

## 2017-10-25 ENCOUNTER — Ambulatory Visit (INDEPENDENT_AMBULATORY_CARE_PROVIDER_SITE_OTHER): Payer: BLUE CROSS/BLUE SHIELD | Admitting: Family Medicine

## 2017-10-25 VITALS — BP 118/70 | Ht 68.5 in | Wt 221.0 lb

## 2017-10-25 DIAGNOSIS — Z23 Encounter for immunization: Secondary | ICD-10-CM | POA: Diagnosis not present

## 2017-10-25 DIAGNOSIS — Z0000 Encounter for general adult medical examination without abnormal findings: Secondary | ICD-10-CM | POA: Diagnosis not present

## 2017-10-25 NOTE — Patient Instructions (Addendum)
Colace capsules otc one daily  miralax one scoop daily f colcace does not work      There is a cemented surgery no evidence of with the nurse early this morning before the third shift is fed was not aroused or anything like that at x30 hours see him some today is 22 year old is a large process is worse is exercise is more so on Celexa this pain emotional I will not any goes through things history things say relatively young like you are you know I had both my parents up until couple years ago my mom passed away is almost 69 and your mom held 59-year-old her mom passed she was

## 2017-10-25 NOTE — Progress Notes (Signed)
   Subjective:    Patient ID: Luke Barnes, male    DOB: 1995-01-09, 22 y.o.   MRN: [id number]  HPI The patient comes in today for a wellness visit.    A review of their health history was completed.  A review of medications was also completed.  Any needed refills; none  Eating habits: health conscious  Falls/  MVA accidents in past few months: none  Regular exercise: work  Barrister's clerk pt sees on regular basis: none  Preventative health issues were discussed.   Additional concerns: none  Would like flu vaccine today.   Breathing has calmed down    advair sticking with and fithfu   Eating overall ok, but maybe too much greasy foods  Bowels occas sharp pain when using the rest roo, discomfort at times.. Not conant     When it hits its painful      No colon or rectal canc   Feels lump aove right testicle,   Off and on for a pcuple months, generlly not sore      Review of Systems  Constitutional: Negative for activity change, appetite change and fever.  HENT: Negative for congestion and rhinorrhea.   Eyes: Negative for discharge.  Respiratory: Negative for cough and wheezing.   Cardiovascular: Negative for chest pain.  Gastrointestinal: Negative for abdominal pain, blood in stool and vomiting.  Genitourinary: Negative for difficulty urinating and frequency.  Musculoskeletal: Negative for neck pain.  Skin: Negative for rash.  Allergic/Immunologic: Negative for environmental allergies and food allergies.  Neurological: Negative for weakness and headaches.  Psychiatric/Behavioral: Negative for agitation.  All other systems reviewed and are negative.      Objective:   Physical Exam  Constitutional: He appears well-developed and well-nourished.  HENT:  Head: Normocephalic and atraumatic.  Right Ear: External ear normal.  Left Ear: External ear normal.  Nose: Nose normal.  Mouth/Throat: Oropharynx is clear and moist.  Eyes: Right eye exhibits no  discharge. Left eye exhibits no discharge. No scleral icterus.  Neck: Normal range of motion. Neck supple. No thyromegaly present.  Cardiovascular: Normal rate, regular rhythm and normal heart sounds.  No murmur heard. Pulmonary/Chest: Effort normal and breath sounds normal. No respiratory distress. He has no wheezes.  Abdominal: Soft. Bowel sounds are normal. He exhibits no distension and no mass. There is no tenderness.  Genitourinary: Penis normal.  Musculoskeletal: Normal range of motion. He exhibits no edema.  Lymphadenopathy:    He has no cervical adenopathy.  Neurological: He is alert. He exhibits normal muscle tone. Coordination normal.  Skin: Skin is warm and dry. No erythema.  Psychiatric: He has a normal mood and affect. His behavior is normal. Judgment normal.          Assessment & Plan:  Impression wellness exam.  Diet discussed.  Exercise discussed.  Asthma is calm down nicely.  Patient has slight symptoms of rectal fissure.  Also was concerned about a mild prominence in the epididymis which turned out to be completely normal.  Stool softeners encouraged./Flu shot today

## 2018-02-13 ENCOUNTER — Encounter: Payer: Self-pay | Admitting: Family Medicine

## 2018-02-13 ENCOUNTER — Ambulatory Visit: Payer: Managed Care, Other (non HMO) | Admitting: Family Medicine

## 2018-02-13 VITALS — BP 122/86 | Temp 98.9°F | Ht 68.5 in | Wt 231.0 lb

## 2018-02-13 DIAGNOSIS — J4520 Mild intermittent asthma, uncomplicated: Secondary | ICD-10-CM

## 2018-02-13 DIAGNOSIS — J329 Chronic sinusitis, unspecified: Secondary | ICD-10-CM

## 2018-02-13 DIAGNOSIS — J310 Chronic rhinitis: Secondary | ICD-10-CM

## 2018-02-13 MED ORDER — AMOXICILLIN-POT CLAVULANATE 875-125 MG PO TABS: ORAL_TABLET | ORAL | 0 refills | Status: DC

## 2018-02-13 MED ORDER — PREDNISONE 20 MG PO TABS: ORAL_TABLET | ORAL | 0 refills | Status: DC

## 2018-02-13 NOTE — Progress Notes (Signed)
   Subjective:    Patient ID: Luke Barnes, male    DOB: 1995/12/16, 23 y.o.   MRN: [id number]  Cough  This is a new problem. Episode onset: 4 days. Associated symptoms comments: Headache, sore throat, wheezing. He has tried nothing for the symptoms.   Felt rough with it   Some prod with th cough   No noticeable fever   Some couh, hit Sunday morn, caused wheezing to flare u   Used even tofay    Headaches off and on some  Back achiness   Appetite down    Energy level not the best      o   Review of Systems  Respiratory: Positive for cough.        Objective:   Physical Exam  Alert, mild malaise. Hydration good Vitals stable. frontal/ maxillary tenderness evident positive nasal congestion. pharynx normal neck supple  lungs clear/no crackles substantial bilateral wheezes. heart regular in rhythm       Assessment & Plan:  Impression rhinosinusitis/bronchitis with substantial exacerbation of asthma.  Warning signs discussed, likely post viral, discussed with patient. plan antibiotics prescribed. Questions answered. Symptomatic care discussed. warning signs discussed. WSL

## 2018-03-06 ENCOUNTER — Other Ambulatory Visit: Payer: Self-pay | Admitting: Family Medicine

## 2018-03-26 ENCOUNTER — Other Ambulatory Visit: Payer: Self-pay

## 2018-03-26 MED ORDER — ALBUTEROL SULFATE HFA 108 (90 BASE) MCG/ACT IN AERS: INHALATION_SPRAY | RESPIRATORY_TRACT | 1 refills | Status: DC

## 2018-09-03 ENCOUNTER — Ambulatory Visit
Admission: EM | Admit: 2018-09-03 | Discharge: 2018-09-03 | Disposition: A | Discharge status: Home / Self Care | Payer: Managed Care, Other (non HMO) | Attending: Emergency Medicine | Admitting: Emergency Medicine

## 2018-09-03 ENCOUNTER — Other Ambulatory Visit: Payer: Self-pay

## 2018-09-03 DIAGNOSIS — Z20828 Contact with and (suspected) exposure to other viral communicable diseases: Secondary | ICD-10-CM

## 2018-09-03 DIAGNOSIS — R05 Cough: Secondary | ICD-10-CM

## 2018-09-03 DIAGNOSIS — R110 Nausea: Secondary | ICD-10-CM

## 2018-09-03 DIAGNOSIS — R195 Other fecal abnormalities: Secondary | ICD-10-CM

## 2018-09-03 DIAGNOSIS — B349 Viral infection, unspecified: Secondary | ICD-10-CM

## 2018-09-03 DIAGNOSIS — Z20822 Contact with and (suspected) exposure to covid-19: Secondary | ICD-10-CM

## 2018-09-03 MED ORDER — ONDANSETRON HCL 4 MG PO TABS: 4.0000 mg | ORAL_TABLET | Freq: Four times a day (QID) | ORAL | 0 refills | Status: DC

## 2018-09-03 NOTE — Discharge Instructions (Addendum)
COVID testing ordered.  It will take 5-7 days for results to return.  Someone will call you with abnormal results.    In the meantime: You should remain isolated in your home for 10 days from symptom onset AND greater than 72 hours after symptoms resolution (absence of fever without the use of fever-reducing medication and improvement in respiratory symptoms), whichever is longer Get plenty of rest and push fluids Zofran prescribed.  Take as needed for nausea Use OTC medications like ibuprofen or tylenol as needed fever or pain Call or go to the ED if you have any new or worsening symptoms such as fever, worsening cough, shortness of breath, chest tightness, chest pain, turning blue, changes in mental status, etc..Marland Kitchen

## 2018-09-03 NOTE — ED Provider Notes (Signed)
[facility]   [id number] 09/03/18 Arrival Time: 1450  CC: Nausea, loose stools, fatigue, cough  SUBJECTIVE:  Luke Barnes is a 23 y.o. male who presents with complaint of nausea, loose stools x 5 episodes, fatigue, and mild dry cough x 2-3 days.  Denies sick exposure to COVID, flu or strep.  Denies recent travel.  Denies eating anything unusual or recent antibiotic use.  Does admit to intermittent lower abdominal discomfort associated with bowel movements.  Has not tried OTC medications.  Symptoms made worse with bowel movements.  Denies similar symptoms in the past.    Denies fever, chills, vomiting, chest pain, SOB, diarrhea, constipation, hematochezia, melena, dysuria, difficulty urinating, increased frequency or urgency, flank pain, loss of bowel or bladder function.  ROS: As per HPI.  All other pertinent ROS negative.     Past Medical History:  Diagnosis Date  . Asthma    Past Surgical History:  Procedure Laterality Date  . NO PAST SURGERIES     No Known Allergies No current facility-administered medications on file prior to encounter.    Current Outpatient Medications on File Prior to Encounter  Medication Sig Dispense Refill  . albuterol (PROVENTIL HFA;VENTOLIN HFA) 108 (90 Base) MCG/ACT inhaler Inhale 2 puffs into the lungs every 6 (six) hours as needed for wheezing or shortness of breath. 1 Inhaler 2  . albuterol (VENTOLIN HFA) 108 (90 Base) MCG/ACT inhaler INHALE 2 PUFFS BY MOUTH EVERY 6 HOURS AS NEEDED FOR WHEEZING AND FOR SHORTNESS OF BREATH 18 each 1  . WIXELA INHUB 100-50 MCG/DOSE AEPB USE 1 INHALATION BY MOUTH  TWO TIMES DAILY 180 each 6   Social History   Socioeconomic History  . Marital status: Single    Spouse name: Not on file  . Number of children: Not on file  . Years of education: Not on file  . Highest education level: Not on file  Occupational History  . Not on file  Social Needs  . Financial resource strain: Not on file  . Food  insecurity    Worry: Not on file    Inability: Not on file  . Transportation needs    Medical: Not on file    Non-medical: Not on file  Tobacco Use  . Smoking status: Former Smoker    Packs/day: 0.01    Years: 1.00    Pack years: 0.01    Types: Cigarettes    Quit date: 01/04/2011    Years since quitting: 7.6  . Smokeless tobacco: Never Used  Substance and Sexual Activity  . Alcohol use: Yes  . Drug use: No  . Sexual activity: Not on file  Lifestyle  . Physical activity    Days per week: Not on file    Minutes per session: Not on file  . Stress: Not on file  Relationships  . Social Herbalist on phone: Not on file    Gets together: Not on file    Attends religious service: Not on file    Active member of club or organization: Not on file    Attends meetings of clubs or organizations: Not on file    Relationship status: Not on file  . Intimate partner violence    Fear of current or ex partner: Not on file    Emotionally abused: Not on file    Physically abused: Not on file    Forced sexual activity: Not on file  Other Topics Concern  . Not on file  Social History Narrative  . Not on file   Family History  Problem Relation Age of Onset  . Asthma Father      OBJECTIVE:  Vitals:   09/03/18 1504  BP: 114/78  Pulse: 89  Resp: 18  Temp: 98.1 F (36.7 C)  SpO2: 97%    General appearance: Alert; NAD HEENT: NCAT.  PERRL, EOMI grossly; Oropharynx clear.  Lungs: clear to auscultation bilaterally without adventitious breath sounds Heart: regular rate and rhythm.   Abdomen: soft, non-distended; mildly TTP over LLQ; nontender at McBurney's point; negative Murphy's sign; no guarding Extremities: no edema; symmetrical with no gross deformities Skin: warm and dry Neurologic: normal gait Psychological: alert and cooperative; normal mood and affect  ASSESSMENT & PLAN:  1. Suspected Covid-19 Virus Infection   2. Nausea without vomiting   3. Viral illness      Meds ordered this encounter  Medications  . ondansetron (ZOFRAN) 4 MG tablet    Sig: Take 1 tablet (4 mg total) by mouth every 6 (six) hours.    Dispense:  12 tablet    Refill:  0    Order Specific Question:   Supervising Provider    Answer:   Eustace MooreELSON, YVONNE SUE [[id number]]    COVID testing ordered.  It will take 5-7 days for results to return.  Someone will call you with abnormal results.    In the meantime: You should remain isolated in your home for 10 days from symptom onset AND greater than 72 hours after symptoms resolution (absence of fever without the use of fever-reducing medication and improvement in respiratory symptoms), whichever is longer Get plenty of rest and push fluids Zofran prescribed.  Take as needed for nausea Use OTC medications like ibuprofen or tylenol as needed fever or pain Call or go to the ED if you have any new or worsening symptoms such as fever, worsening cough, shortness of breath, chest tightness, chest pain, turning blue, changes in mental status, etc...    Reviewed expectations re: course of current medical issues. Questions answered. Outlined signs and symptoms indicating need for more acute intervention. Patient verbalized understanding. After Visit Summary given.   Rennis HardingWurst, Kaz Auld, PA-C 09/03/18 1536

## 2018-09-03 NOTE — ED Triage Notes (Signed)
Pt reports nausea without vomiting and diarrhea  For past 2 days with mild abdominal pain

## 2018-09-05 LAB — NOVEL CORONAVIRUS, NAA: SARS-CoV-2, NAA: NOT DETECTED

## 2018-12-13 IMAGING — CR DG CHEST 2V
4 series · 4 of 4 positions shown · non-contrast
Comparison: Chest x-ray of January 11, 2015

CLINICAL DATA: Onset of shortness of breath and right arm cramping
while at work today. History of asthma. Former smoker.

EXAM:
CHEST  2 VIEW

[chest pa (1 of 2)]
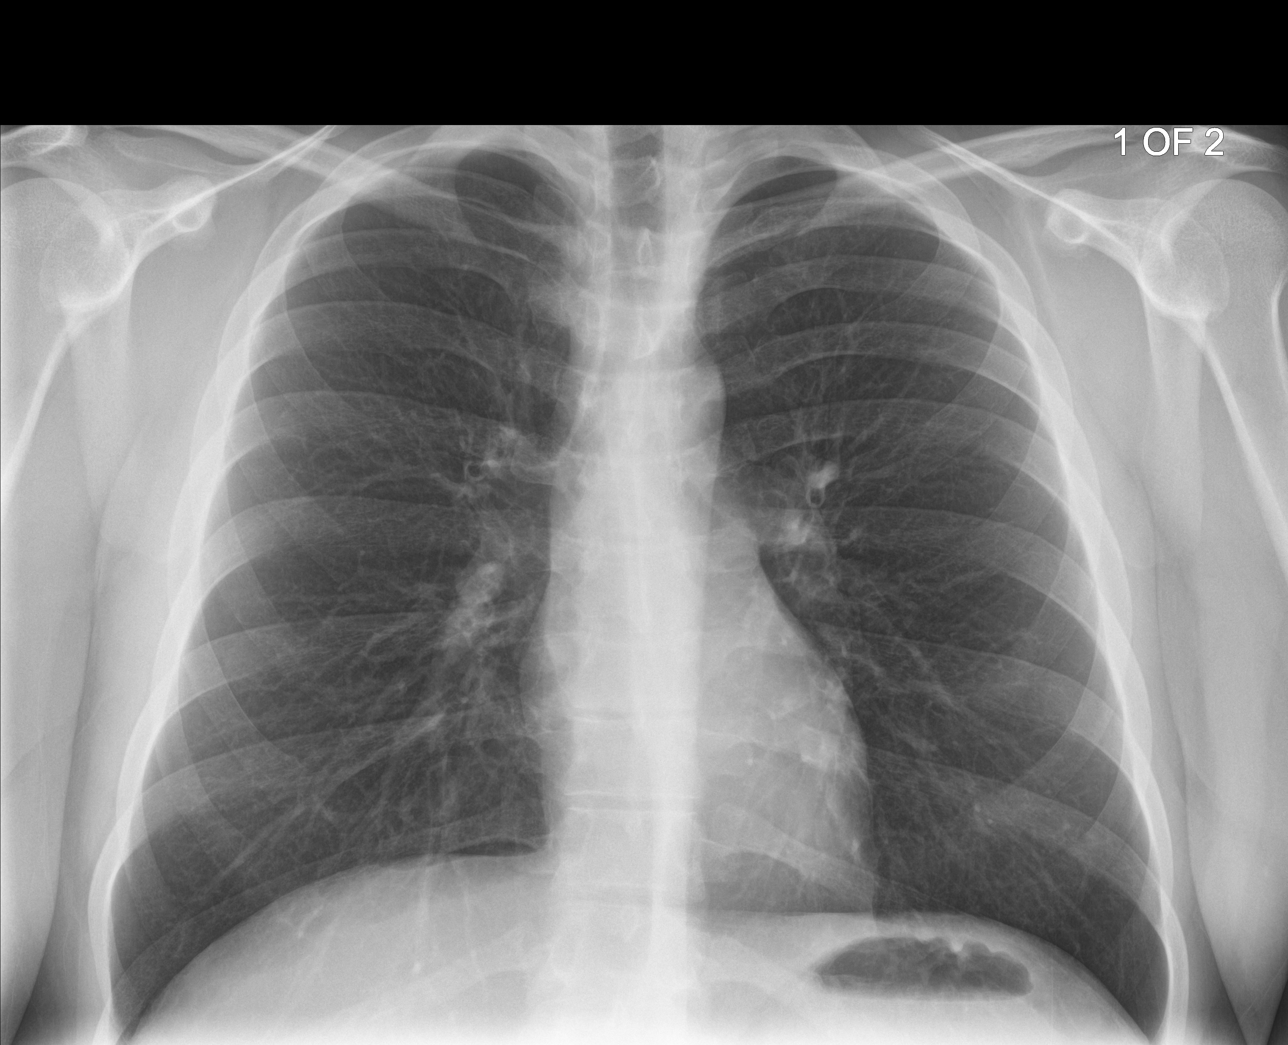

[chest lat (1 of 2)]
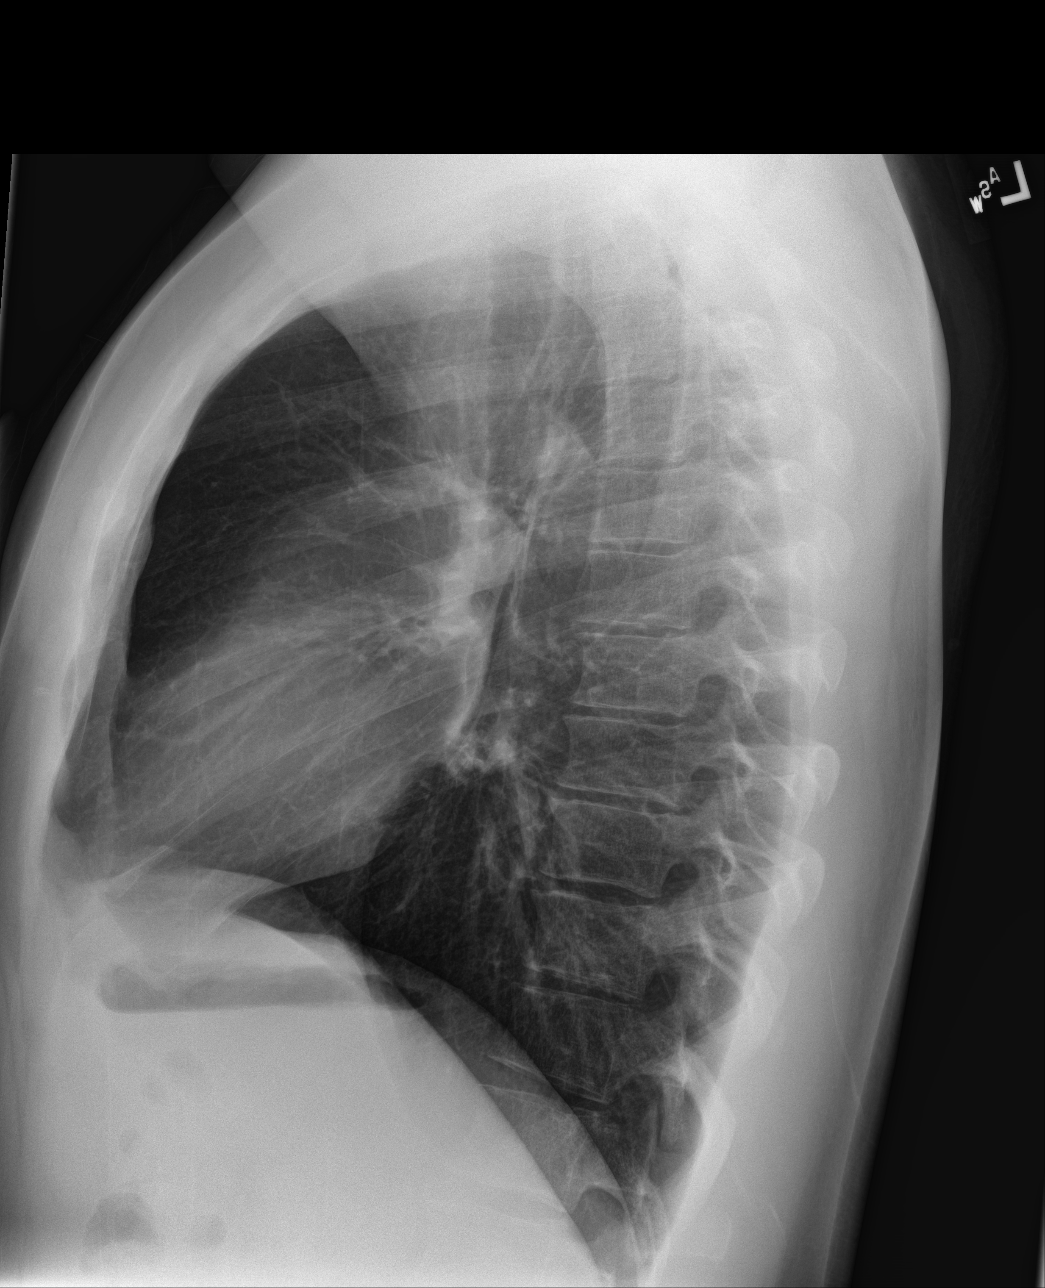

[chest pa (2 of 2)]
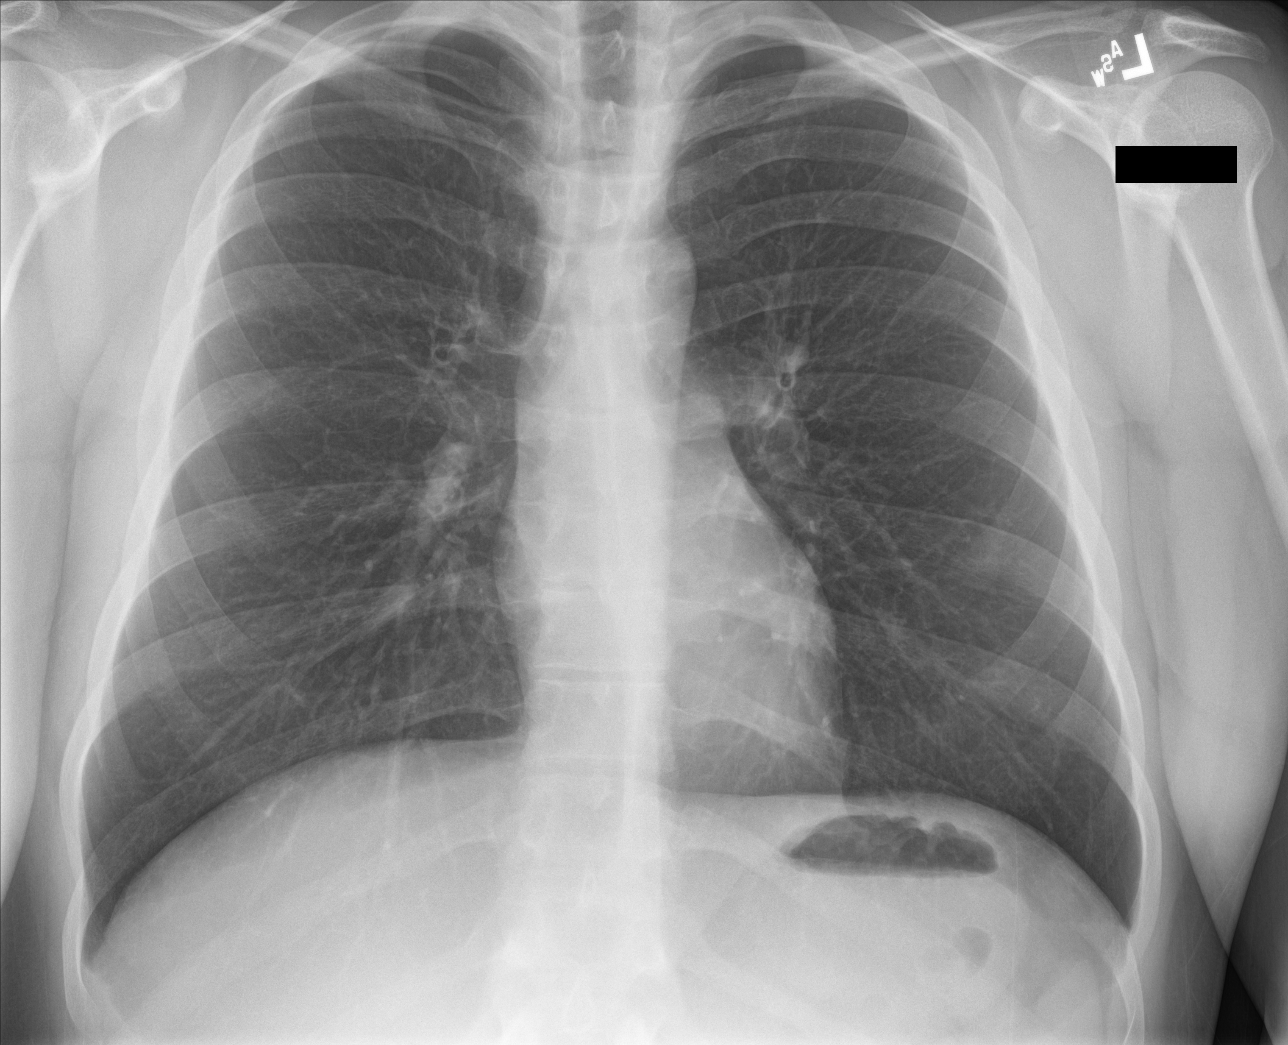

[chest lat (2 of 2)]
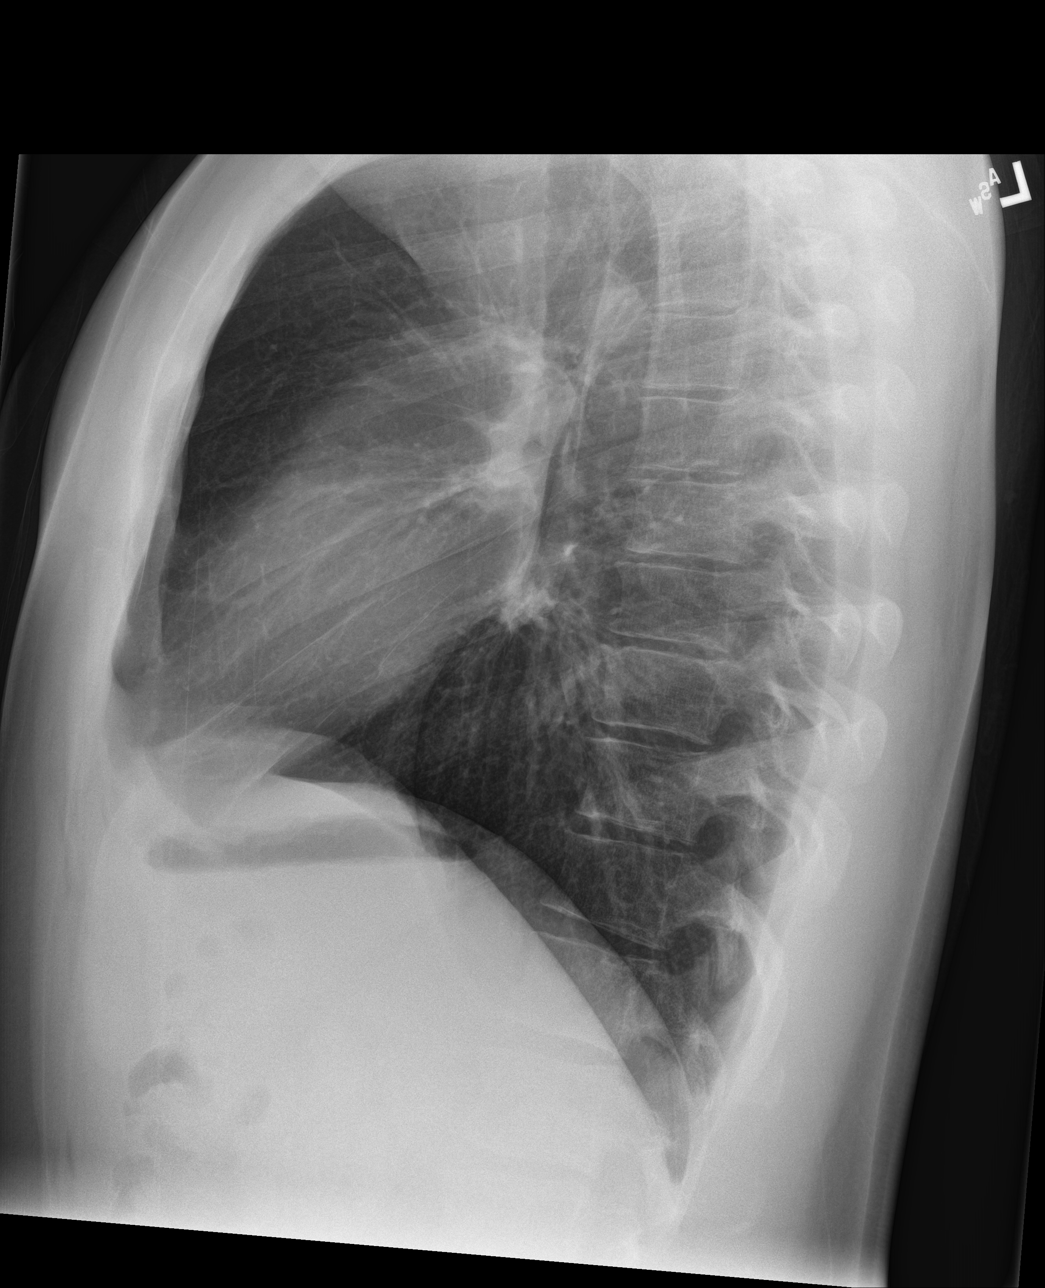

[4 of 4 positions shown; findings below may reference images not displayed]

FINDINGS: The lungs are mildly hyperinflated and clear. There is no
pneumothorax or pleural effusion. The heart and pulmonary
vascularity are normal. The mediastinum is normal in width. The bony
thorax exhibits no acute abnormality.
IMPRESSION: There is no active cardiopulmonary disease. Mild hyperinflation
consistent with known reactive airway disease.

## 2019-01-30 ENCOUNTER — Encounter: Payer: Self-pay | Admitting: Family Medicine

## 2019-01-31 ENCOUNTER — Encounter: Payer: Self-pay | Admitting: Family Medicine

## 2019-03-25 ENCOUNTER — Other Ambulatory Visit: Payer: Self-pay | Admitting: Family Medicine

## 2019-06-21 ENCOUNTER — Telehealth: Payer: Self-pay | Admitting: Family Medicine

## 2019-06-21 NOTE — Telephone Encounter (Signed)
Needs appt, not had refill since 3/20.  Thx.  Dr. Ladona Ridgel

## 2019-06-21 NOTE — Telephone Encounter (Signed)
Patient is requesting refill on albuterol inhaler last filled 03/26/18 and last seen 02/13/2018 .[phone] [facility] Please advise

## 2019-06-21 NOTE — Telephone Encounter (Signed)
Please advise. Thank you

## 2019-06-21 NOTE — Telephone Encounter (Signed)
Please contact patient to have him set up appt. Thank you! 

## 2019-06-25 ENCOUNTER — Telehealth: Payer: Self-pay | Admitting: Family Medicine

## 2019-06-25 NOTE — Telephone Encounter (Signed)
Tried to schedule appointment but mail box is full and couldn't leave message.

## 2019-06-25 NOTE — Telephone Encounter (Signed)
Pt is requesting 90 day supply of pro air inhaler instead of ventolin insurance will no longer cover.   [facility]

## 2019-06-25 NOTE — Telephone Encounter (Signed)
Dr taylor's note on 6/18 states he needs visit first. Please call and schedule. thanks

## 2019-07-03 NOTE — Telephone Encounter (Signed)
Ive called several times trying to get patient so we can schedule medication follow up but mail box is full.

## 2019-07-24 ENCOUNTER — Ambulatory Visit: Payer: Managed Care, Other (non HMO) | Admitting: Family Medicine

## 2019-08-09 ENCOUNTER — Encounter: Payer: Self-pay | Admitting: Nurse Practitioner

## 2019-08-09 ENCOUNTER — Ambulatory Visit: Payer: Managed Care, Other (non HMO) | Admitting: Nurse Practitioner

## 2019-08-09 ENCOUNTER — Other Ambulatory Visit: Payer: Self-pay

## 2019-08-09 VITALS — BP 110/78 | HR 107 | Temp 97.6°F | Ht 68.5 in | Wt 231.0 lb

## 2019-08-09 DIAGNOSIS — J4531 Mild persistent asthma with (acute) exacerbation: Secondary | ICD-10-CM

## 2019-08-09 DIAGNOSIS — K219 Gastro-esophageal reflux disease without esophagitis: Secondary | ICD-10-CM | POA: Diagnosis not present

## 2019-08-09 MED ORDER — ALBUTEROL SULFATE HFA 108 (90 BASE) MCG/ACT IN AERS: INHALATION_SPRAY | RESPIRATORY_TRACT | 0 refills | Status: DC

## 2019-08-09 MED ORDER — FLUTICASONE-SALMETEROL 250-50 MCG/DOSE IN AEPB: 1.0000 | INHALATION_SPRAY | Freq: Two times a day (BID) | RESPIRATORY_TRACT | 1 refills | Status: DC

## 2019-08-09 MED ORDER — FAMOTIDINE 40 MG PO TABS: 40.0000 mg | ORAL_TABLET | Freq: Every day | ORAL | 1 refills | Status: AC

## 2019-08-09 NOTE — Progress Notes (Signed)
Subjective:    Patient ID: Luke Barnes, male    DOB: 1995/12/10, 24 y.o.   MRN: [id number]  HPI med check up on asthma.  Has had issues with his asthma lately.  Using his albuterol about twice per day.  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  Does not wear a mask while working.  Is also going to school.  Wears a mask at that time without difficulty.  Notices wheezing when he is eating lunch, no particular foods.  Also has at least one episode during the night where he will wake up and feel like it is hard to breathe.  Currently on generic Advair 100/50 twice a day.  Adherent to regimen.  Does off-and-on cigarette smoking and vaping.  States he is trying to cut back.  Has had some cough over the past week which has improved.  Noticed this after going to the beach last week for vacation.  No fever.  Does not cough up any mucus, states he swallows it.  Denies any bad taste in his mouth.  No chest pain. Last used albuterol inhaler about 2 hours ago.  Drinks a large amount of caffeine.  Eats a lot of citrus and spicy foods.  Denies any excessive NSAID use.  Occasional social alcohol use, not daily. Family history of GERD unknown.  Has also gained some weight over the past few months.  No abdominal pain.  Occasional heartburn symptoms but not daily.   Review of Systems     Objective:   Physical Exam NAD.  Alert, oriented.  Calm affect.  Lungs scattered faint expiratory wheezes noted throughout lung fields with occasional inspiratory wheeze.  Also occasional coarse expiratory crackles noted.  Minimal cough.  No tachypnea.  Heart regular rate rhythm.  Abdomen soft nondistended nontender. Today's Vitals   08/09/19 1105  BP: 110/78  Pulse: (!) 107  Temp: 97.6 F (36.4 C)  SpO2: 95%  Weight: 231 lb (104.8 kg)  Height: 5' 8.5" (1.74 m)   Body mass index is 34.61 kg/m.        Assessment & Plan:   Problem List Items Addressed This Visit      Respiratory   Asthma, chronic - Primary   Relevant  Medications   Fluticasone-Salmeterol (WIXELA INHUB) 250-50 MCG/DOSE AEPB   albuterol (VENTOLIN HFA) 108 (90 Base) MCG/ACT inhaler     Digestive   Gastroesophageal reflux disease without esophagitis   Relevant Medications   famotidine (PEPCID) 40 MG tablet     Meds ordered this encounter  Medications  . famotidine (PEPCID) 40 MG tablet    Sig: Take 1 tablet (40 mg total) by mouth daily. Prn acid reflux    Dispense:  90 tablet    Refill:  1    Order Specific Question:   Supervising Provider    Answer:   Lilyan Punt A [[id number]]  . Fluticasone-Salmeterol (WIXELA INHUB) 250-50 MCG/DOSE AEPB    Sig: Inhale 1 puff into the lungs in the morning and at bedtime. To prevent wheezing    Dispense:  180 each    Refill:  1    Order Specific Question:   Supervising Provider    Answer:   Lilyan Punt A [[id number]]  . albuterol (VENTOLIN HFA) 108 (90 Base) MCG/ACT inhaler    Sig: INHALE 2 PUFFS BY MOUTH EVERY 4 HOURS AS NEEDED FOR WHEEZING AND FOR SHORTNESS OF BREATH    Dispense:  18 g    Refill:  0  Order Specific Question:   Supervising Provider    Answer:   Lilyan Punt A [[id number]]   Increase the daily dose of Wixela. The goal is to reduce the need for albuterol to no more than 2-3 times per week at most.  Recommend wearing mask at work to decrease exposure to dust. Discussed lifestyle factors affecting reflux. Encouraged patient to watch diet, stop tobacco use and set weight loss goal of 10 lbs.  Start Pepcid as directed prn reflux. Avoid eating within 3 hours of bedtime.  Return in about 6 months (around 02/09/2020) for Recheck asthma . Call back in about 4 weeks if no improvement.  30 minutes was spent with the patient including previsit chart review, time spent with patient, discussion of health issues, review of data including medical record, and documentation of the visit.

## 2019-08-10 DIAGNOSIS — K219 Gastro-esophageal reflux disease without esophagitis: Secondary | ICD-10-CM | POA: Insufficient documentation

## 2019-10-01 ENCOUNTER — Other Ambulatory Visit: Payer: Self-pay | Admitting: Nurse Practitioner

## 2019-12-17 ENCOUNTER — Other Ambulatory Visit: Payer: Self-pay | Admitting: Family Medicine

## 2019-12-27 ENCOUNTER — Other Ambulatory Visit: Payer: Self-pay | Admitting: Nurse Practitioner

## 2019-12-30 NOTE — Telephone Encounter (Signed)
Last check up on asthma was 08/09/19

## 2020-04-21 ENCOUNTER — Other Ambulatory Visit: Payer: Self-pay | Admitting: Family Medicine

## 2020-06-11 ENCOUNTER — Other Ambulatory Visit: Payer: Self-pay | Admitting: Family Medicine

## 2020-06-12 NOTE — Telephone Encounter (Signed)
Last check up on asthma 08/09/19

## 2020-07-08 ENCOUNTER — Other Ambulatory Visit: Payer: Self-pay | Admitting: Family Medicine

## 2020-10-12 ENCOUNTER — Telehealth: Payer: Self-pay | Admitting: Family Medicine

## 2020-10-12 NOTE — Telephone Encounter (Signed)
Patient has appointment with Dr. Adriana Simas 11/8. Needs refill on albuterol.   [facility]  CB#  [phone]

## 2020-10-13 MED ORDER — ALBUTEROL SULFATE HFA 108 (90 BASE) MCG/ACT IN AERS: 1.0000 | INHALATION_SPRAY | Freq: Four times a day (QID) | RESPIRATORY_TRACT | 3 refills | Status: AC | PRN

## 2020-10-13 NOTE — Telephone Encounter (Signed)
Rx refilled.   Everlene Other DO

## 2020-11-10 ENCOUNTER — Ambulatory Visit: Payer: Managed Care, Other (non HMO) | Admitting: Family Medicine

## 2020-11-10 ENCOUNTER — Encounter: Payer: Self-pay | Admitting: Family Medicine

## 2020-12-10 ENCOUNTER — Other Ambulatory Visit: Payer: Self-pay | Admitting: Family Medicine

## 2021-03-08 ENCOUNTER — Ambulatory Visit: Payer: Self-pay | Admitting: Family Medicine
